# Patient Record
Sex: Female | Born: 1955 | Race: White | Hispanic: No | Smoking: Never smoker
Health system: Southern US, Community
[De-identification: ages and names within clinical notes are randomized; demographics above are authoritative.]

## PROBLEM LIST (undated history)

## (undated) DIAGNOSIS — E079 Disorder of thyroid, unspecified: Secondary | ICD-10-CM

## (undated) DIAGNOSIS — T7840XA Allergy, unspecified, initial encounter: Secondary | ICD-10-CM

## (undated) DIAGNOSIS — E785 Hyperlipidemia, unspecified: Secondary | ICD-10-CM

## (undated) DIAGNOSIS — K648 Other hemorrhoids: Secondary | ICD-10-CM

## (undated) DIAGNOSIS — K5790 Diverticulosis of intestine, part unspecified, without perforation or abscess without bleeding: Secondary | ICD-10-CM

## (undated) DIAGNOSIS — D72829 Elevated white blood cell count, unspecified: Principal | ICD-10-CM

## (undated) DIAGNOSIS — F419 Anxiety disorder, unspecified: Secondary | ICD-10-CM

## (undated) DIAGNOSIS — C9110 Chronic lymphocytic leukemia of B-cell type not having achieved remission: Principal | ICD-10-CM

## (undated) DIAGNOSIS — I10 Essential (primary) hypertension: Secondary | ICD-10-CM

## (undated) HISTORY — DX: Disorder of thyroid, unspecified: E07.9

## (undated) HISTORY — PX: BREAST SURGERY: SHX581

## (undated) HISTORY — DX: Hyperlipidemia, unspecified: E78.5

## (undated) HISTORY — DX: Other hemorrhoids: K64.8

## (undated) HISTORY — DX: Allergy, unspecified, initial encounter: T78.40XA

## (undated) HISTORY — DX: Elevated white blood cell count, unspecified: D72.829

## (undated) HISTORY — DX: Essential (primary) hypertension: I10

## (undated) HISTORY — PX: BREAST BIOPSY: SHX20

## (undated) HISTORY — DX: Chronic lymphocytic leukemia of B-cell type not having achieved remission: C91.10

## (undated) HISTORY — DX: Anxiety disorder, unspecified: F41.9

## (undated) HISTORY — DX: Diverticulosis of intestine, part unspecified, without perforation or abscess without bleeding: K57.90

---

## 1998-11-10 ENCOUNTER — Ambulatory Visit (HOSPITAL_BASED_OUTPATIENT_CLINIC_OR_DEPARTMENT_OTHER): Admission: RE | Admit: 1998-11-10 | Discharge: 1998-11-10 | Payer: Self-pay

## 1999-06-26 ENCOUNTER — Other Ambulatory Visit: Admission: RE | Admit: 1999-06-26 | Discharge: 1999-06-26 | Payer: Self-pay | Admitting: Gynecology

## 2000-01-08 ENCOUNTER — Other Ambulatory Visit: Admission: RE | Admit: 2000-01-08 | Discharge: 2000-01-08 | Payer: Self-pay | Admitting: Gynecology

## 2000-01-14 ENCOUNTER — Encounter: Admission: RE | Admit: 2000-01-14 | Discharge: 2000-01-14 | Payer: Self-pay | Admitting: Gynecology

## 2000-01-14 ENCOUNTER — Encounter: Payer: Self-pay | Admitting: Gynecology

## 2000-01-22 ENCOUNTER — Encounter (INDEPENDENT_AMBULATORY_CARE_PROVIDER_SITE_OTHER): Payer: Self-pay

## 2000-01-22 ENCOUNTER — Other Ambulatory Visit: Admission: RE | Admit: 2000-01-22 | Discharge: 2000-01-22 | Payer: Self-pay | Admitting: Gynecology

## 2000-05-12 ENCOUNTER — Other Ambulatory Visit: Admission: RE | Admit: 2000-05-12 | Discharge: 2000-05-12 | Payer: Self-pay | Admitting: Gynecology

## 2001-01-12 ENCOUNTER — Other Ambulatory Visit: Admission: RE | Admit: 2001-01-12 | Discharge: 2001-01-12 | Payer: Self-pay | Admitting: Gynecology

## 2002-03-25 ENCOUNTER — Encounter: Payer: Self-pay | Admitting: Gynecology

## 2002-03-25 ENCOUNTER — Encounter: Admission: RE | Admit: 2002-03-25 | Discharge: 2002-03-25 | Payer: Self-pay | Admitting: Gynecology

## 2002-03-31 ENCOUNTER — Other Ambulatory Visit: Admission: RE | Admit: 2002-03-31 | Discharge: 2002-03-31 | Payer: Self-pay | Admitting: Gynecology

## 2004-01-23 ENCOUNTER — Other Ambulatory Visit: Admission: RE | Admit: 2004-01-23 | Discharge: 2004-01-23 | Payer: Self-pay | Admitting: Gynecology

## 2004-01-24 ENCOUNTER — Encounter: Admission: RE | Admit: 2004-01-24 | Discharge: 2004-01-24 | Payer: Self-pay | Admitting: Gynecology

## 2005-02-06 ENCOUNTER — Other Ambulatory Visit: Admission: RE | Admit: 2005-02-06 | Discharge: 2005-02-06 | Payer: Self-pay | Admitting: Gynecology

## 2005-02-19 ENCOUNTER — Encounter: Admission: RE | Admit: 2005-02-19 | Discharge: 2005-02-19 | Payer: Self-pay | Admitting: Gynecology

## 2006-02-10 ENCOUNTER — Other Ambulatory Visit: Admission: RE | Admit: 2006-02-10 | Discharge: 2006-02-10 | Payer: Self-pay | Admitting: Gynecology

## 2006-02-20 ENCOUNTER — Encounter: Admission: RE | Admit: 2006-02-20 | Discharge: 2006-02-20 | Payer: Self-pay | Admitting: Gynecology

## 2007-02-24 ENCOUNTER — Encounter: Admission: RE | Admit: 2007-02-24 | Discharge: 2007-02-24 | Payer: Self-pay | Admitting: Gynecology

## 2007-04-02 ENCOUNTER — Other Ambulatory Visit: Admission: RE | Admit: 2007-04-02 | Discharge: 2007-04-02 | Payer: Self-pay | Admitting: Gynecology

## 2007-05-11 DIAGNOSIS — K648 Other hemorrhoids: Secondary | ICD-10-CM

## 2007-05-11 HISTORY — DX: Other hemorrhoids: K64.8

## 2008-03-23 ENCOUNTER — Encounter: Admission: RE | Admit: 2008-03-23 | Discharge: 2008-03-23 | Payer: Self-pay | Admitting: Gynecology

## 2009-04-10 ENCOUNTER — Encounter: Admission: RE | Admit: 2009-04-10 | Discharge: 2009-04-10 | Payer: Self-pay | Admitting: Gynecology

## 2010-05-08 ENCOUNTER — Encounter: Admission: RE | Admit: 2010-05-08 | Discharge: 2010-05-08 | Payer: Self-pay | Admitting: Gynecology

## 2010-07-04 ENCOUNTER — Encounter: Admission: RE | Admit: 2010-07-04 | Discharge: 2010-07-04 | Payer: Self-pay | Admitting: Family Medicine

## 2010-08-28 ENCOUNTER — Ambulatory Visit
Admission: RE | Admit: 2010-08-28 | Discharge: 2010-08-28 | Payer: Self-pay | Source: Home / Self Care | Attending: Gynecology | Admitting: Gynecology

## 2010-12-04 LAB — POCT I-STAT, CHEM 8
BUN: 14 mg/dL (ref 6–23)
Calcium, Ion: 1.2 mmol/L (ref 1.12–1.32)
Chloride: 104 mEq/L (ref 96–112)
HCT: 40 % (ref 36.0–46.0)
Hemoglobin: 13.6 g/dL (ref 12.0–15.0)
Potassium: 4.1 mEq/L (ref 3.5–5.1)
Sodium: 140 mEq/L (ref 135–145)
TCO2: 26 mmol/L (ref 0–100)

## 2011-04-17 ENCOUNTER — Other Ambulatory Visit: Payer: Self-pay | Admitting: Gynecology

## 2011-04-17 DIAGNOSIS — Z1231 Encounter for screening mammogram for malignant neoplasm of breast: Secondary | ICD-10-CM

## 2011-05-14 ENCOUNTER — Ambulatory Visit
Admission: RE | Admit: 2011-05-14 | Discharge: 2011-05-14 | Disposition: A | Discharge status: Home / Self Care | Payer: BC Managed Care – PPO | Source: Ambulatory Visit | Attending: Gynecology | Admitting: Gynecology

## 2011-05-14 DIAGNOSIS — Z1231 Encounter for screening mammogram for malignant neoplasm of breast: Secondary | ICD-10-CM

## 2011-05-30 ENCOUNTER — Other Ambulatory Visit: Payer: Self-pay | Admitting: Gynecology

## 2012-03-04 ENCOUNTER — Other Ambulatory Visit: Payer: Self-pay | Admitting: Physician Assistant

## 2012-03-30 ENCOUNTER — Telehealth: Payer: Self-pay

## 2012-03-30 MED ORDER — LISINOPRIL 5 MG PO TABS: 5.0000 mg | ORAL_TABLET | Freq: Every day | ORAL | Status: DC

## 2012-03-30 NOTE — Telephone Encounter (Signed)
Rx sent in, [facility] notifying patient and advising recheck needed.

## 2012-03-30 NOTE — Telephone Encounter (Signed)
PT STATES SHE IS OUT OF TOWN AND LEFT HER LISINOPRIL AT HOME, NEED TO HAVE [place] CALL THE PHARMACY FOR JUST A FEW UNTIL SHE GET BACK HOME PLEASE CALL [phone]   [facility] ON [address] IN [place]

## 2012-04-24 ENCOUNTER — Encounter: Payer: Self-pay | Admitting: Physician Assistant

## 2012-04-24 DIAGNOSIS — R002 Palpitations: Secondary | ICD-10-CM

## 2012-04-24 DIAGNOSIS — I10 Essential (primary) hypertension: Secondary | ICD-10-CM

## 2012-04-24 DIAGNOSIS — E7800 Pure hypercholesterolemia, unspecified: Secondary | ICD-10-CM

## 2012-06-10 ENCOUNTER — Other Ambulatory Visit: Payer: Self-pay | Admitting: Gynecology

## 2012-06-10 DIAGNOSIS — Z1231 Encounter for screening mammogram for malignant neoplasm of breast: Secondary | ICD-10-CM

## 2012-06-16 ENCOUNTER — Ambulatory Visit (INDEPENDENT_AMBULATORY_CARE_PROVIDER_SITE_OTHER): Payer: BC Managed Care – PPO | Admitting: Family Medicine

## 2012-06-16 VITALS — BP 112/77 | HR 70 | Temp 98.3°F | Resp 16 | Ht 65.5 in | Wt 148.0 lb

## 2012-06-16 DIAGNOSIS — I10 Essential (primary) hypertension: Secondary | ICD-10-CM

## 2012-06-16 DIAGNOSIS — E039 Hypothyroidism, unspecified: Secondary | ICD-10-CM

## 2012-06-16 DIAGNOSIS — Z79899 Other long term (current) drug therapy: Secondary | ICD-10-CM

## 2012-06-16 MED ORDER — ALPRAZOLAM 0.25 MG PO TABS: 0.2500 mg | ORAL_TABLET | Freq: Every evening | ORAL | Status: DC | PRN

## 2012-06-16 MED ORDER — LISINOPRIL 5 MG PO TABS: 5.0000 mg | ORAL_TABLET | Freq: Every day | ORAL | Status: DC

## 2012-06-16 NOTE — Patient Instructions (Addendum)

## 2012-06-20 ENCOUNTER — Encounter: Payer: Self-pay | Admitting: Family Medicine

## 2012-06-20 NOTE — Progress Notes (Signed)
S: This 56 y.o. Cauc female has HTN, Hypothyroidism, Lipid disorder and Menopause symptoms (treated by GYN) who was last seen in Dec 2012 by Dr. Hal Hope.  She is compliant with medications w/o adverse effects. She denies any change in  activity, diaphoresis, fatigue, CP or tightness, palpitations, edema, cough or SOB, dizziness, HA, syncope or weakness. She needs medication refills; no labs today. (Had labs in Aug 2012).    Pt had cardiac evaluation with Dr. Delrae Rend in Dec 2012; palpitations thought to be due to [facility] or PVC (he did not suspect PSVT or other complex arrhythmias). Stress ECHO: 06/19/2010- Normal.  Metoprolol 25 mg was added to be taken prn palpitations.   ROS: As per HPI; otherwise, noncontributory.  O: Filed Vitals:   06/16/12 1428  BP: 112/77                               BMI: 24.5  Pulse: 70  Temp: 98.3 F (36.8 C)  Resp: 16   GEN: In NAD: WN,WD. HENT: [place]/AT; EOMI, conj/scl clear. NECK: Supple w/o LAN or TMG. COR: RRR ; no edema LUNGS: Normal resp rate and effort. NEURO: A&O x 3; CNs intact, otherwise no focal deficits.  LABS (05/01/2011):  CMET- normal (K+= 4.0, Cr= 0.84, LFTs normal)    TSH= 2.101   CRP= 0.1           (01/28/2011):  Lipids-  TChol= 128   TGs= 151   HDL= 39   LDL= 59   A/P: 1. Benign essential HTN -stable and well controlled     Continue current medication and exercise as tolerated  2. Unspecified hypothyroidism     No medication change at this time

## 2012-06-30 ENCOUNTER — Ambulatory Visit
Admission: RE | Admit: 2012-06-30 | Discharge: 2012-06-30 | Disposition: A | Discharge status: Home / Self Care | Payer: BC Managed Care – PPO | Source: Ambulatory Visit | Attending: Gynecology | Admitting: Gynecology

## 2012-06-30 DIAGNOSIS — Z1231 Encounter for screening mammogram for malignant neoplasm of breast: Secondary | ICD-10-CM

## 2012-07-02 ENCOUNTER — Other Ambulatory Visit: Payer: Self-pay | Admitting: Gynecology

## 2012-11-25 ENCOUNTER — Ambulatory Visit (INDEPENDENT_AMBULATORY_CARE_PROVIDER_SITE_OTHER): Payer: BC Managed Care – PPO | Admitting: Physician Assistant

## 2012-11-25 DIAGNOSIS — F419 Anxiety disorder, unspecified: Secondary | ICD-10-CM | POA: Insufficient documentation

## 2012-11-25 DIAGNOSIS — J309 Allergic rhinitis, unspecified: Secondary | ICD-10-CM

## 2012-11-25 DIAGNOSIS — E039 Hypothyroidism, unspecified: Secondary | ICD-10-CM | POA: Insufficient documentation

## 2012-11-25 DIAGNOSIS — R05 Cough: Secondary | ICD-10-CM

## 2012-11-25 DIAGNOSIS — H6690 Otitis media, unspecified, unspecified ear: Secondary | ICD-10-CM

## 2012-11-25 DIAGNOSIS — R059 Cough, unspecified: Secondary | ICD-10-CM

## 2012-11-25 MED ORDER — GUAIFENESIN ER 1200 MG PO TB12: 1.0000 | ORAL_TABLET | Freq: Two times a day (BID) | ORAL | Status: DC | PRN

## 2012-11-25 MED ORDER — AMOXICILLIN 875 MG PO TABS
875.0000 mg | ORAL_TABLET | Freq: Two times a day (BID) | ORAL | Status: DC
Start: 2012-11-25 — End: 2012-12-07

## 2012-11-25 MED ORDER — BENZONATATE 100 MG PO CAPS: 100.0000 mg | ORAL_CAPSULE | Freq: Three times a day (TID) | ORAL | Status: DC | PRN

## 2012-11-25 MED ORDER — IPRATROPIUM BROMIDE 0.03 % NA SOLN: 2.0000 | Freq: Two times a day (BID) | NASAL | Status: DC

## 2012-11-25 MED ORDER — FLUTICASONE PROPIONATE 50 MCG/ACT NA SUSP: 2.0000 | Freq: Every day | NASAL | Status: DC

## 2012-11-25 NOTE — Progress Notes (Signed)
Subjective:    Patient ID: Kelly [name], female    DOB: [birth year], 57 y.o.   MRN: [id number]  HPI This 57 y.o. female presents for evaluation of respiratory illness and new RIGHT ear pain. Began about a week ago with scratchy sore throat, then sinus drainage and cough.  Yesterday she developed RIGHT ear pain.  No fever, chills, nausea, vomiting, diarrhea. No unexplained myalgias/arthralgias.  No rash. She takes an OTC antihistamine most days, without complete resolution of her allergy symptoms or nasal congestion and rhinorrhea.   Past Medical History  Diagnosis Date  . Thyroid disease   . Anxiety   . Allergy   . Hypertension   . Hyperlipidemia     Past Surgical History  Procedure Laterality Date  . Breast surgery Right     benign lesion    Prior to Admission medications   Medication Sig Start Date End Date Taking? Authorizing Provider  ALPRAZolam (XANAX) 0.25 MG tablet Take 1 tablet (0.25 mg total) by mouth at bedtime as needed. 06/16/12  Yes Maurice March, MD  estradiol (VIVELLE-DOT) 0.1 MG/24HR Place 1 patch onto the skin 2 (two) times a week.   Yes Historical Provider, MD  levothyroxine (SYNTHROID, LEVOTHROID) 50 MCG tablet Take 50 mcg by mouth daily.   Yes Historical Provider, MD  lisinopril (PRINIVIL,ZESTRIL) 5 MG tablet Take 1 tablet (5 mg total) by mouth daily. 06/16/12  Yes Maurice March, MD  progesterone (PROMETRIUM) 200 MG capsule Take 200 mg by mouth daily.   Yes Historical Provider, MD  rosuvastatin (CRESTOR) 10 MG tablet Take 10 mg by mouth daily. 1/2 tab qd   Yes Historical Provider, MD    No Known Allergies  History   Social History  . Marital Status: Divorced    Spouse Name: n/a    Number of Children: 2  . Years of Education: 13+   Occupational History  . accountant    Social History Main Topics  . Smoking status: Never Smoker   . Smokeless tobacco: Never Used  . Alcohol Use: Yes     Comment: rarely  . Drug Use: No  . Sexually  Active: No   Other Topics Concern  . Not on file   Social History Narrative   Lives alone.  Her adult children live nearby.    Family History  Problem Relation Age of Onset  . Cancer Mother 92    pancreatic cancer  . Cancer Father 69    lung cancer; +tobacco    Review of Systems As above.    Objective:   Physical Exam Blood pressure 138/70, pulse 84, temperature 98.8 F (37.1 C), resp. rate 16, height 5\' 5"  (1.651 m), weight 150 lb (68.04 kg). Body mass index is 24.96 kg/(m^2). Well-developed, well nourished WF who is awake, alert and oriented, in NAD. HEENT: [place]/AT, PERRL, EOMI.  Sclera and conjunctiva are clear.  EAC are patent, LEFT TM is normal in appearance. RIGHT TM is opaque, injected and bulging. Nasal mucosa is congested, dark pink and moist. OP is clear. Neck: supple, non-tender, no lymphadenopathy, thyromegaly. Heart: RRR, no murmur Lungs: normal effort, CTA Extremities: no cyanosis, clubbing or edema. Skin: warm and dry without rash. Psychologic: good mood and appropriate affect, normal speech and behavior.     Assessment & Plan:  AOM (acute otitis media), right - Plan: ipratropium (ATROVENT) 0.03 % nasal spray, Guaifenesin (MUCINEX MAXIMUM STRENGTH) 1200 MG TB12, amoxicillin (AMOXIL) 875 MG tablet  Allergic rhinitis - Plan: fluticasone (FLONASE) 50  MCG/ACT nasal spray  Cough - Plan: benzonatate (TESSALON) 100 MG capsule

## 2012-11-25 NOTE — Patient Instructions (Signed)
Get plenty of rest and drink at least 64 ounces of water daily. 

## 2012-12-05 ENCOUNTER — Encounter: Payer: Self-pay | Admitting: Physician Assistant

## 2012-12-07 ENCOUNTER — Other Ambulatory Visit: Payer: Self-pay | Admitting: Physician Assistant

## 2012-12-07 MED ORDER — AMOXICILLIN-POT CLAVULANATE 875-125 MG PO TABS: 1.0000 | ORAL_TABLET | Freq: Two times a day (BID) | ORAL | Status: DC

## 2012-12-14 ENCOUNTER — Telehealth: Payer: Self-pay

## 2012-12-14 MED ORDER — FLUCONAZOLE 150 MG PO TABS: 150.0000 mg | ORAL_TABLET | Freq: Once | ORAL | Status: DC

## 2012-12-14 NOTE — Telephone Encounter (Signed)
Pt is requesting Diflucan  [employer] [facility] [address]   [phone]

## 2012-12-14 NOTE — Telephone Encounter (Signed)
Called patient to advise this was sent in.

## 2013-03-16 ENCOUNTER — Encounter: Payer: Self-pay | Admitting: Physician Assistant

## 2013-03-16 DIAGNOSIS — R002 Palpitations: Secondary | ICD-10-CM

## 2013-04-14 ENCOUNTER — Ambulatory Visit (INDEPENDENT_AMBULATORY_CARE_PROVIDER_SITE_OTHER): Payer: BC Managed Care – PPO | Admitting: Physician Assistant

## 2013-04-14 VITALS — BP 110/70 | HR 90 | Temp 98.4°F | Resp 16 | Ht 65.0 in | Wt 150.4 lb

## 2013-04-14 DIAGNOSIS — J069 Acute upper respiratory infection, unspecified: Secondary | ICD-10-CM

## 2013-04-14 DIAGNOSIS — J029 Acute pharyngitis, unspecified: Secondary | ICD-10-CM

## 2013-04-14 DIAGNOSIS — I10 Essential (primary) hypertension: Secondary | ICD-10-CM

## 2013-04-14 LAB — POCT RAPID STREP A (OFFICE): Rapid Strep A Screen: NEGATIVE

## 2013-04-14 MED ORDER — AZITHROMYCIN 250 MG PO TABS: ORAL_TABLET | ORAL | Status: DC

## 2013-04-14 MED ORDER — PREDNISONE 20 MG PO TABS: ORAL_TABLET | ORAL | Status: DC

## 2013-04-14 MED ORDER — LISINOPRIL 5 MG PO TABS: 5.0000 mg | ORAL_TABLET | Freq: Every day | ORAL | Status: DC

## 2013-04-14 NOTE — Progress Notes (Signed)
Patient ID: Kelly [name] MRN: [id number], DOB: Apr 07, 1956, 57 y.o. Date of Encounter: 04/14/2013, 3:14 PM  Primary Physician: No primary provider on file.  Chief Complaint: Sore throat x 3 days and nasal congestion  HPI: 57 y.o. female with history below presents with 3 day history of sore throat and nasal congestion. Afebrile. No chills. Feels like her sore throat is secondary to post nasal drip. Some cough secondary to clearing her throat from post nasal drip. No chest congestion. No sinus pressure. No ear fullness or otalgia. No known sick contacts, but she is not certain if her grandchildren have been sick.   She also needs a refill on her lisinopril 5 mg. Takes daily without issues. No CP, chest tightness, headaches, vision changes, or focal deficits. Has been signed off on from Dr. Jacinto Halim regarding her palpitations. She will see him prn.    Past Medical History  Diagnosis Date  . Thyroid disease   . Anxiety   . Allergy   . Hypertension   . Hyperlipidemia      Home Meds: Prior to Admission medications   Medication Sig Start Date End Date Taking? Authorizing Provider  ALPRAZolam (XANAX) 0.25 MG tablet Take 1 tablet (0.25 mg total) by mouth at bedtime as needed. 06/16/12  Yes Maurice March, MD  estradiol (VIVELLE-DOT) 0.1 MG/24HR Place 1 patch onto the skin 2 (two) times a week.   Yes Historical Provider, MD  levothyroxine (SYNTHROID, LEVOTHROID) 50 MCG tablet Take 50 mcg by mouth daily.   Yes Historical Provider, MD  lisinopril (PRINIVIL,ZESTRIL) 5 MG tablet Take 1 tablet (5 mg total) by mouth daily. 06/16/12  Yes Maurice March, MD  progesterone (PROMETRIUM) 200 MG capsule Take 200 mg by mouth daily.   Yes Historical Provider, MD  rosuvastatin (CRESTOR) 10 MG tablet Take 10 mg by mouth daily. 1/2 tab qd   Yes Historical Provider, MD    Allergies: No Known Allergies  History   Social History  . Marital Status: Divorced    Spouse Name: n/a    Number of  Children: 2  . Years of Education: 13+   Occupational History  . accountant    Social History Main Topics  . Smoking status: Never Smoker   . Smokeless tobacco: Never Used  . Alcohol Use: Yes     Comment: rarely  . Drug Use: No  . Sexually Active: No   Other Topics Concern  . Not on file   Social History Narrative   Lives alone.  Her adult children live nearby.     Review of Systems: Constitutional: negative for chills, fever, or fatigue  HEENT: see above Cardiovascular: negative for chest pain or palpitations Respiratory: negative for wheezing, shortness of breath, or cough Abdominal: negative for abdominal pain, nausea, vomiting, or diarrhea Dermatological: negative for rash Neurologic: negative for headache, dizziness, or syncope   Physical Exam: Blood pressure 110/70, pulse 90, temperature 98.4 F (36.9 C), temperature source Oral, resp. rate 16, height 5\' 5"  (1.651 m), weight 150 lb 6.4 oz (68.221 kg), SpO2 99.00%., Body mass index is 25.03 kg/(m^2). General: Well developed, well nourished, in no acute distress. Head: Normocephalic, atraumatic, eyes without discharge, sclera non-icteric, nares are without discharge. Bilateral auditory canals clear, TM's are without perforation, pearly grey and translucent with reflective cone of light bilaterally. No sinus TTP. Oral cavity moist, posterior pharynx with post nasal drip and mild erythema. No exudate or peritonsillar abscess. Uvula midline.   Neck: Supple. No thyromegaly. Full  ROM. No lymphadenopathy. Lungs: Clear bilaterally to auscultation without wheezes, rales, or rhonchi. Breathing is unlabored. Heart: RRR with S1 S2. No murmurs, rubs, or gallops appreciated. Msk:  Strength and tone normal for age. Extremities/Skin: Warm and dry. No clubbing or cyanosis. No edema. No rashes or suspicious lesions. Neuro: Alert and oriented X 3. Moves all extremities spontaneously. Gait is normal. CNII-XII grossly in tact. Psych:   Responds to questions appropriately with a normal affect.   Labs: Results for orders placed in visit on 04/14/13  POCT RAPID STREP A (OFFICE)      Result Value Range   Rapid Strep A Screen Negative  Negative    BMP pending  ASSESSMENT AND PLAN:  57 y.o. female with URI and well controlled HTN  1) URI -Prednisone 20 mg #12 3x2, 2x2, 1x2 no RF -Azithromycin 250 MG #6 2 po first day then 1 po next 4 days no RF -Restart Atrovent nasal spray  2) Hypertension -Well controlled -Refilled lisinopril 5 mg 1 po daily #90 RF 1 -Healthy diet and exercise   Signed, Eula Listen, PA-C 04/14/2013 3:14 PM

## 2013-04-15 LAB — BASIC METABOLIC PANEL
CO2: 29 mEq/L (ref 19–32)
Chloride: 102 mEq/L (ref 96–112)
Sodium: 138 mEq/L (ref 135–145)

## 2013-04-26 ENCOUNTER — Encounter: Payer: Self-pay | Admitting: Physician Assistant

## 2013-04-26 MED ORDER — AMOXICILLIN 875 MG PO TABS: 875.0000 mg | ORAL_TABLET | Freq: Two times a day (BID) | ORAL | Status: DC

## 2013-07-09 ENCOUNTER — Other Ambulatory Visit: Payer: Self-pay

## 2013-07-09 DIAGNOSIS — Z1231 Encounter for screening mammogram for malignant neoplasm of breast: Secondary | ICD-10-CM

## 2013-07-13 ENCOUNTER — Other Ambulatory Visit: Payer: Self-pay | Admitting: Family Medicine

## 2013-07-29 ENCOUNTER — Other Ambulatory Visit: Payer: Self-pay

## 2013-08-06 ENCOUNTER — Ambulatory Visit
Admission: RE | Admit: 2013-08-06 | Discharge: 2013-08-06 | Disposition: A | Discharge status: Home / Self Care | Payer: BC Managed Care – PPO | Source: Ambulatory Visit

## 2013-08-06 DIAGNOSIS — Z1231 Encounter for screening mammogram for malignant neoplasm of breast: Secondary | ICD-10-CM

## 2013-09-02 ENCOUNTER — Ambulatory Visit (INDEPENDENT_AMBULATORY_CARE_PROVIDER_SITE_OTHER): Payer: BC Managed Care – PPO | Admitting: Physician Assistant

## 2013-09-02 ENCOUNTER — Encounter: Payer: Self-pay | Admitting: Physician Assistant

## 2013-09-02 VITALS — BP 121/74 | HR 75 | Temp 98.2°F | Resp 16 | Ht 65.25 in | Wt 158.0 lb

## 2013-09-02 DIAGNOSIS — F411 Generalized anxiety disorder: Secondary | ICD-10-CM

## 2013-09-02 DIAGNOSIS — E7800 Pure hypercholesterolemia, unspecified: Secondary | ICD-10-CM

## 2013-09-02 DIAGNOSIS — F419 Anxiety disorder, unspecified: Secondary | ICD-10-CM

## 2013-09-02 DIAGNOSIS — Z1159 Encounter for screening for other viral diseases: Secondary | ICD-10-CM

## 2013-09-02 DIAGNOSIS — E039 Hypothyroidism, unspecified: Secondary | ICD-10-CM

## 2013-09-02 DIAGNOSIS — I10 Essential (primary) hypertension: Secondary | ICD-10-CM

## 2013-09-02 DIAGNOSIS — R7989 Other specified abnormal findings of blood chemistry: Secondary | ICD-10-CM

## 2013-09-02 DIAGNOSIS — Z0000 Encounter for general adult medical examination without abnormal findings: Secondary | ICD-10-CM

## 2013-09-02 DIAGNOSIS — J309 Allergic rhinitis, unspecified: Secondary | ICD-10-CM

## 2013-09-02 LAB — POCT URINALYSIS DIPSTICK
Bilirubin, UA: NEGATIVE
Blood, UA: NEGATIVE
Glucose, UA: NEGATIVE
Ketones, UA: NEGATIVE
pH, UA: 5.5

## 2013-09-02 LAB — COMPREHENSIVE METABOLIC PANEL
ALT: 15 U/L (ref 0–35)
Albumin: 4.5 g/dL (ref 3.5–5.2)
CO2: 29 mEq/L (ref 19–32)
Calcium: 9.4 mg/dL (ref 8.4–10.5)
Chloride: 101 mEq/L (ref 96–112)
Glucose, Bld: 87 mg/dL (ref 70–99)
Potassium: 4.1 mEq/L (ref 3.5–5.3)
Sodium: 135 mEq/L (ref 135–145)
Total Bilirubin: 0.4 mg/dL (ref 0.3–1.2)
Total Protein: 6.7 g/dL (ref 6.0–8.3)

## 2013-09-02 LAB — POCT UA - MICROSCOPIC ONLY
Mucus, UA: NEGATIVE
RBC, urine, microscopic: NEGATIVE

## 2013-09-02 LAB — LIPID PANEL: Total CHOL/HDL Ratio: 3 Ratio

## 2013-09-02 LAB — TSH: TSH: 2.895 u[IU]/mL (ref 0.350–4.500)

## 2013-09-02 MED ORDER — LEVOTHYROXINE SODIUM 50 MCG PO TABS: 50.0000 ug | ORAL_TABLET | Freq: Every day | ORAL | Status: DC

## 2013-09-02 MED ORDER — ROSUVASTATIN CALCIUM 10 MG PO TABS: 5.0000 mg | ORAL_TABLET | Freq: Every day | ORAL | Status: DC

## 2013-09-02 NOTE — Progress Notes (Signed)
Subjective:    Patient ID: Kelly [name], female    DOB: 1956/07/25, 57 y.o.   MRN: [id number]   PCP: Celestino Ackerman, PA-C  Chief Complaint  Patient presents with  . Annual Exam   HPI  Presents for Annual Wellness Exam.  Had a colonoscopy 6-7 years ago with Dr. Kinnie Scales.  Reportedly normal. Breast and paps with GYN.  Current with mammogram, pap, etc. Flu vaccine is current.    Active Ambulatory Problems    Diagnosis Date Noted  . Essential hypertension, benign 04/24/2012  . Palpitations 04/24/2012  . Pure hypercholesterolemia 04/24/2012  . Anxiety 11/25/2012  . Hypothyroidism 11/25/2012  . Allergic rhinitis 11/25/2012   Resolved Ambulatory Problems    Diagnosis Date Noted  . No Resolved Ambulatory Problems   Past Medical History  Diagnosis Date  . Thyroid disease   . Allergy   . Hypertension   . Hyperlipidemia     Past Surgical History  Procedure Laterality Date  . Breast surgery Right     benign lesion    No Known Allergies  Prior to Admission medications   Medication Sig Start Date End Date Taking? Authorizing Provider  ALPRAZolam (XANAX) 0.25 MG tablet Take 1 tablet (0.25 mg total) by mouth at bedtime as needed. 06/16/12  Yes Maurice March, MD  estradiol (VIVELLE-DOT) 0.1 MG/24HR Place 1 patch onto the skin 2 (two) times a week.   Yes Historical Provider, MD  levothyroxine (SYNTHROID, LEVOTHROID) 50 MCG tablet Take 1 tablet (50 mcg total) by mouth daily. 09/02/13  Yes Wadsworth Skolnick S Taeko Schaffer, PA-C  lisinopril (PRINIVIL,ZESTRIL) 5 MG tablet Take 1 tablet (5 mg total) by mouth daily. 04/14/13  Yes Ryan M Dunn, PA-C  progesterone (PROMETRIUM) 100 MG capsule Take 100 mg by mouth daily.   Yes Historical Provider, MD  rosuvastatin (CRESTOR) 10 MG tablet Take 0.5 tablets (5 mg total) by mouth daily. 1/2 tab qd 09/02/13  Yes [place] Liz Tessa Lerner, PA-C    History   Social History  . Marital Status: Divorced    Spouse Name: n/a    Number of Children: 2  . Years  of Education: 13+   Occupational History  . accountant    Social History Main Topics  . Smoking status: Never Smoker   . Smokeless tobacco: Never Used  . Alcohol Use: Yes     Comment: rarely  . Drug Use: No  . Sexual Activity: No   Other Topics Concern  . None   Social History Narrative   Lives alone.  Her adult children live nearby.    family history includes Cancer (age of onset: 47) in her mother; Cancer (age of onset: 61) in her father. indicated that her mother is deceased. She indicated that her father is deceased. She indicated that her sister is alive. She indicated that her daughter is alive. She indicated that her son is alive.    Review of Systems  Constitutional: Negative.   HENT: Negative.   Eyes: Negative.   Respiratory: Negative.   Cardiovascular: Negative.   Gastrointestinal: Negative.   Endocrine: Negative.   Genitourinary: Negative for dysuria, urgency, frequency, hematuria, flank pain, decreased urine volume, vaginal discharge, enuresis, difficulty urinating, genital sores, vaginal pain, menstrual problem, pelvic pain and dyspareunia. Vaginal bleeding: spotting for 3 days, since dose change for estrogen and progesterone.  Musculoskeletal: Negative.   Skin: Negative.   Allergic/Immunologic: Negative.   Neurological: Negative.   Hematological: Negative.   Psychiatric/Behavioral: Negative.  Objective:   Physical Exam  Vitals reviewed. Constitutional: She is oriented to person, place, and time. Vital signs are normal. She appears well-developed and well-nourished. She is active and cooperative. No distress.  HENT:  Head: Normocephalic and atraumatic.  Right Ear: Hearing, tympanic membrane, external ear and ear canal normal. No foreign bodies.  Left Ear: Hearing, tympanic membrane, external ear and ear canal normal. No foreign bodies.  Nose: Nose normal.  Mouth/Throat: Uvula is midline, oropharynx is clear and moist and mucous membranes are  normal. No oral lesions. Normal dentition. No dental abscesses or uvula swelling. No oropharyngeal exudate.  Eyes: Conjunctivae, EOM and lids are normal. Pupils are equal, round, and reactive to light. Right eye exhibits no discharge. Left eye exhibits no discharge. No scleral icterus.  Fundoscopic exam:      The right eye shows no arteriolar narrowing, no AV nicking, no exudate, no hemorrhage and no papilledema. The right eye shows red reflex.       The left eye shows no arteriolar narrowing, no AV nicking, no exudate, no hemorrhage and no papilledema. The left eye shows red reflex.  Neck: Trachea normal, normal range of motion and full passive range of motion without pain. Neck supple. No spinous process tenderness and no muscular tenderness present. No mass and no thyromegaly present.  Cardiovascular: Normal rate, regular rhythm, normal heart sounds, intact distal pulses and normal pulses.   Pulmonary/Chest: Effort normal and breath sounds normal.  Abdominal: Soft. Bowel sounds are normal. She exhibits no distension and no mass. There is no tenderness. There is no rebound and no guarding.  Musculoskeletal: She exhibits no edema and no tenderness.       Cervical back: Normal.       Thoracic back: Normal.       Lumbar back: Normal.  Lymphadenopathy:       Head (right side): No tonsillar, no preauricular, no posterior auricular and no occipital adenopathy present.       Head (left side): No tonsillar, no preauricular, no posterior auricular and no occipital adenopathy present.    She has no cervical adenopathy.       Right: No supraclavicular adenopathy present.       Left: No supraclavicular adenopathy present.  Neurological: She is alert and oriented to person, place, and time. She has normal strength and normal reflexes. No cranial nerve deficit. She exhibits normal muscle tone. Coordination and gait normal.  Skin: Skin is warm, dry and intact. No rash noted. She is not diaphoretic. No  cyanosis or erythema. Nails show no clubbing.  Psychiatric: She has a normal mood and affect. Her speech is normal and behavior is normal. Judgment and thought content normal.      Assessment & Plan:  1. Routine general medical examination at a health care facility Age appropriate anticipatory guidance provided. - POCT UA - Microscopic Only - POCT urinalysis dipstick  2. Essential hypertension, benign Controlled.  Continue current treatment. - CBC with Differential - Comprehensive metabolic panel  3. Pure hypercholesterolemia Await labs. - Comprehensive metabolic panel - Lipid panel - rosuvastatin (CRESTOR) 10 MG tablet; Take 0.5 tablets (5 mg total) by mouth daily. 1/2 tab qd  Dispense: 90 tablet; Refill: 1  4. Hypothyroidism Await labs - TSH - levothyroxine (SYNTHROID, LEVOTHROID) 50 MCG tablet; Take 1 tablet (50 mcg total) by mouth daily.  Dispense: 90 tablet; Refill: 1  5. Anxiety Controlled with PRN alprazolam.  6. Allergic rhinitis Controlled.  7. Need for hepatitis C screening  test - Hepatitis C antibody  Return in about 6 months (around 03/03/2014).  Fernande Bras, PA-C Physician Assistant-Certified [facility]

## 2013-09-02 NOTE — Patient Instructions (Signed)
I will contact you with your lab results as soon as they are available.   If you have not heard from me in 2 weeks, please contact me.  The fastest way to get your results is to register for My Chart (see the instructions on the last page of this printout).  Keeping You Healthy  Get These Tests  Blood Pressure- Have your blood pressure checked by your healthcare provider at least once a year.  Normal blood pressure is 120/80.  Weight- Have your body mass index (BMI) calculated to screen for obesity.  BMI is a measure of body fat based on height and weight.  You can calculate your own BMI at www.nhlbisupport.com/bmi/  Cholesterol- Have your cholesterol checked every year.  Diabetes- Have your blood sugar checked every year if you have high blood pressure, high cholesterol, a family history of diabetes or if you are overweight.  Pap Smear- Have a pap smear every 1 to 5 years if you have been sexually active.  If you are older than 65 and recent pap smears have been normal you may not need additional pap smears.  In addition, if you have had a hysterectomy  For benign disease additional pap smears are not necessary.  Mammogram-Yearly mammograms are essential for early detection of breast cancer  Screening for Colon Cancer- Colonoscopy starting at age 50. Screening may begin sooner depending on your family history and other health conditions.  Follow up colonoscopy as directed by your Gastroenterologist.  Screening for Osteoporosis- Screening begins at age 65 with bone density scanning, sooner if you are at higher risk for developing Osteoporosis.  Get these medicines  Calcium with Vitamin D- Your body requires 1200-1500 mg of Calcium a day and 800-1000 IU of Vitamin D a day.  You can only absorb 500 mg of Calcium at a time therefore Calcium must be taken in 2 or 3 separate doses throughout the day.  Hormones- Hormone therapy has been associated with increased risk for certain cancers and  heart disease.  Talk to your healthcare provider about if you need relief from menopausal symptoms.  Aspirin- Ask your healthcare provider about taking Aspirin to prevent Heart Disease and Stroke.  Get these Immuniztions  Flu shot- Every fall  Pneumonia shot- Once after the age of 65; if you are younger ask your healthcare provider if you need a pneumonia shot.  Tetanus- Every ten years.  Zostavax- Once after the age of 60 to prevent shingles.  Take these steps  Don't smoke- Your healthcare provider can help you quit. For tips on how to quit, ask your healthcare provider or go to www.smokefree.gov or call 1-800 QUIT-NOW.  Be physically active- Exercise 5 days a week for a minimum of 30 minutes.  If you are not already physically active, start slow and gradually work up to 30 minutes of moderate physical activity.  Try walking, dancing, bike riding, swimming, etc.  Eat a healthy diet- Eat a variety of healthy foods such as fruits, vegetables, whole grains, low fat milk, low fat cheeses, yogurt, lean meats, chicken, fish, eggs, dried beans, tofu, etc.  For more information go to www.thenutritionsource.org  Dental visit- Brush and floss teeth twice daily; visit your dentist twice a year.  Eye exam- Visit your Optometrist or Ophthalmologist yearly.  Drink alcohol in moderation- Limit alcohol intake to one drink or less a day.  Never drink and drive.  Depression- Your emotional health is as important as your physical health.  If you're feeling   down or losing interest in things you normally enjoy, please talk to your healthcare provider.  Seat Belts- can save your life; always wear one  Smoke/Carbon Monoxide detectors- These detectors need to be installed on the appropriate level of your home.  Replace batteries at least once a year.  Violence- If anyone is threatening or hurting you, please tell your healthcare provider.  Living Will/ Health care power of attorney- Discuss with your  healthcare provider and family. 

## 2013-09-03 LAB — CBC WITH DIFFERENTIAL/PLATELET
Basophils Relative: 0 % (ref 0–1)
Eosinophils Absolute: 0.2 10*3/uL (ref 0.0–0.7)
HCT: 39.2 % (ref 36.0–46.0)
Hemoglobin: 13.6 g/dL (ref 12.0–15.0)
Lymphocytes Relative: 71 % — ABNORMAL HIGH (ref 12–46)
Lymphs Abs: 13.2 10*3/uL — ABNORMAL HIGH (ref 0.7–4.0)
MCH: 31.5 pg (ref 26.0–34.0)
MCHC: 34.7 g/dL (ref 30.0–36.0)
Monocytes Relative: 4 % (ref 3–12)
Neutrophils Relative %: 24 % — ABNORMAL LOW (ref 43–77)
RBC: 4.32 MIL/uL (ref 3.87–5.11)

## 2013-09-03 LAB — HEPATITIS C ANTIBODY: HCV Ab: NEGATIVE

## 2013-09-06 NOTE — Addendum Note (Signed)
Addended by: Fernande Bras on: 09/06/2013 03:56 PM   Modules accepted: Orders

## 2013-09-09 ENCOUNTER — Other Ambulatory Visit (INDEPENDENT_AMBULATORY_CARE_PROVIDER_SITE_OTHER): Payer: BC Managed Care – PPO | Admitting: *Deleted

## 2013-09-09 DIAGNOSIS — R7989 Other specified abnormal findings of blood chemistry: Secondary | ICD-10-CM

## 2013-09-09 NOTE — Progress Notes (Signed)
Pt here for labs only. 

## 2013-09-10 LAB — IMMUNOPHENOTYPING BY FLOW CYTOMETRY

## 2013-09-14 ENCOUNTER — Encounter: Payer: Self-pay | Admitting: Hematology and Oncology

## 2013-09-14 ENCOUNTER — Telehealth: Payer: Self-pay | Admitting: *Deleted

## 2013-09-14 ENCOUNTER — Other Ambulatory Visit: Payer: Self-pay | Admitting: Hematology and Oncology

## 2013-09-14 ENCOUNTER — Telehealth: Payer: Self-pay | Admitting: Hematology and Oncology

## 2013-09-14 DIAGNOSIS — D72829 Elevated white blood cell count, unspecified: Secondary | ICD-10-CM

## 2013-09-14 HISTORY — DX: Elevated white blood cell count, unspecified: D72.829

## 2013-09-14 NOTE — Telephone Encounter (Signed)
Left Vm for pt requesting she call back to confirm appt made for tomorrow at 10:30 am.

## 2013-09-14 NOTE — Telephone Encounter (Signed)
I placed order to see tomorrow

## 2013-09-14 NOTE — Telephone Encounter (Signed)
C/D 09/14/13 for appt. 09/20/13

## 2013-09-14 NOTE — Telephone Encounter (Signed)
S/W REFERRING OFFICE AND GVE NP APPT 12/29 @ 9:30 W/DR. GORSUCH REFERRING DR. [name] [name] DX- ABN CBC  WELCOME PACKET MAILED.

## 2013-09-14 NOTE — Telephone Encounter (Signed)
C/D 09/14/13 for appt. 09/15/13

## 2013-09-14 NOTE — Telephone Encounter (Signed)
Pt left VM asking if any way she can get in to see Dr. Bertis Ruddy sooner than 12/29?  States was told by her PCP she may have Leukemia and she is very concerned.

## 2013-09-14 NOTE — Telephone Encounter (Signed)
Pt returned call and will come in tomorrow at 10:30 am.

## 2013-09-14 NOTE — Addendum Note (Signed)
Addended by: Fernande Bras on: 09/14/2013 01:31 PM   Modules accepted: Orders

## 2013-09-15 ENCOUNTER — Ambulatory Visit: Payer: BC Managed Care – PPO

## 2013-09-15 ENCOUNTER — Ambulatory Visit (HOSPITAL_BASED_OUTPATIENT_CLINIC_OR_DEPARTMENT_OTHER): Payer: BC Managed Care – PPO | Admitting: Hematology and Oncology

## 2013-09-15 ENCOUNTER — Encounter: Payer: Self-pay | Admitting: Hematology and Oncology

## 2013-09-15 VITALS — BP 120/69 | HR 78 | Temp 98.2°F | Resp 18 | Ht 65.0 in | Wt 157.1 lb

## 2013-09-15 DIAGNOSIS — C9110 Chronic lymphocytic leukemia of B-cell type not having achieved remission: Secondary | ICD-10-CM

## 2013-09-15 HISTORY — DX: Chronic lymphocytic leukemia of B-cell type not having achieved remission: C91.10

## 2013-09-15 NOTE — Progress Notes (Signed)
[facility] CONSULT NOTE  Patient Care Team: Fernande Bras, PA-C as PCP - General (Physician Assistant) Jean Rosenthal, NP as Nurse Practitioner (Gynecology) Artis Delay, MD as Consulting Physician (Hematology and Oncology)  CHIEF COMPLAINTS/PURPOSE OF CONSULTATION:  Lymphocytosis, newly diagnosed CLL  HISTORY OF PRESENTING ILLNESS:  Kelly [name] 57 y.o. female is here because of recent diagnosis of CLL. On 09/02/2013, she went to her primary care provider for routine visit and have routine blood work done. We do not have results of prior blood work. On 09/02/2013, CBC showed a white blood cell count of 18.5 with 71% lymphocytes. She has no anemia or thrombocytopenia. Flow cytometry confirmed the diagnosis of CLL She denies any new lymphadenopathy. She denies any recent fever, chills, night sweats or abnormal weight loss The recent infection of bleeding. She is up-to-date with all her preventive care including colonoscopy, Pap smear and screening mammogram She is up-to-date with influenza vaccination.  MEDICAL HISTORY:  Past Medical History  Diagnosis Date  . Thyroid disease   . Anxiety   . Allergy   . Hypertension   . Hyperlipidemia   . Leukocytosis, unspecified 09/14/2013  . CLL (chronic lymphocytic leukemia) 09/15/2013    SURGICAL HISTORY: Past Surgical History  Procedure Laterality Date  . Breast surgery Right     benign lesion    SOCIAL HISTORY: History   Social History  . Marital Status: Divorced    Spouse Name: n/a    Number of Children: 2  . Years of Education: 13+   Occupational History  . accountant   .     Social History Main Topics  . Smoking status: Never Smoker   . Smokeless tobacco: Never Used  . Alcohol Use: Yes     Comment: rarely  . Drug Use: No  . Sexual Activity: No   Other Topics Concern  . Not on file   Social History Narrative   Lives alone.  Her adult children live nearby.    FAMILY HISTORY: Family  History  Problem Relation Age of Onset  . Cancer Mother 6    pancreatic cancer  . Cancer Father 18    lung cancer; +tobacco    ALLERGIES:  has No Known Allergies.  MEDICATIONS:  Current Outpatient Prescriptions  Medication Sig Dispense Refill  . ALPRAZolam (XANAX) 0.25 MG tablet Take 1 tablet (0.25 mg total) by mouth at bedtime as needed.  30 tablet  1  . estradiol (VIVELLE-DOT) 0.1 MG/24HR Place 0.5 patches onto the skin 2 (two) times a week.       . levothyroxine (SYNTHROID, LEVOTHROID) 50 MCG tablet Take 1 tablet (50 mcg total) by mouth daily.  90 tablet  1  . lisinopril (PRINIVIL,ZESTRIL) 5 MG tablet Take 1 tablet (5 mg total) by mouth daily.  90 tablet  1  . progesterone (PROMETRIUM) 100 MG capsule Take 100 mg by mouth daily.      . rosuvastatin (CRESTOR) 10 MG tablet Take 0.5 tablets (5 mg total) by mouth daily. 1/2 tab qd  90 tablet  1   No current facility-administered medications for this visit.    REVIEW OF SYSTEMS:   Constitutional: Denies fevers, chills or abnormal night sweats Eyes: Denies blurriness of vision, double vision or watery eyes Ears, nose, mouth, throat, and face: Denies mucositis or sore throat Respiratory: Denies cough, dyspnea or wheezes Cardiovascular: Denies palpitation, chest discomfort or lower extremity swelling Gastrointestinal:  Denies nausea, heartburn or change in bowel habits Skin: Denies abnormal  skin rashes Lymphatics: Denies new lymphadenopathy or easy bruising Neurological:Denies numbness, tingling or new weaknesses Behavioral/Psych: Mood is stable, no new changes  All other systems were reviewed with the patient and are negative.  PHYSICAL EXAMINATION: ECOG PERFORMANCE STATUS: 0 - Asymptomatic  Filed Vitals:   09/15/13 1038  BP: 120/69  Pulse: 78  Temp: 98.2 F (36.8 C)  Resp: 18   Filed Weights   09/15/13 1038  Weight: 157 lb 1.6 oz (71.26 kg)    GENERAL:alert, no distress and comfortable SKIN: skin color, texture,  turgor are normal, no rashes or significant lesions EYES: normal, conjunctiva are pink and non-injected, sclera clear OROPHARYNX:no exudate, no erythema and lips, buccal mucosa, and tongue normal  NECK: supple, thyroid normal size, non-tender, without nodularity LYMPH:  no palpable lymphadenopathy in the cervical, axillary or inguinal LUNGS: clear to auscultation and percussion with normal breathing effort HEART: regular rate & rhythm and no murmurs and no lower extremity edema ABDOMEN:abdomen soft, non-tender and normal bowel sounds Musculoskeletal:no cyanosis of digits and no clubbing  PSYCH: alert & oriented x 3 with fluent speech NEURO: no focal motor/sensory deficits  LABORATORY DATA:  I have reviewed the data as listed Recent Results (from the past 2160 hour(s))  CBC WITH DIFFERENTIAL     Status: Abnormal   Collection Time    09/02/13  8:17 AM      Result Value Range   WBC 18.5 (*) 4.0 - 10.5 K/uL   RBC 4.32  3.87 - 5.11 MIL/uL   Hemoglobin 13.6  12.0 - 15.0 g/dL   HCT 16.1  09.6 - 04.5 %   MCV 90.7  78.0 - 100.0 fL   MCH 31.5  26.0 - 34.0 pg   MCHC 34.7  30.0 - 36.0 g/dL   RDW 40.9  81.1 - 91.4 %   Platelets 217  150 - 400 K/uL   Comment: Platelet clumps noted on smear-count appears adequate.   Neutrophils Relative % 24 (*) 43 - 77 %   Neutro Abs 4.3  1.7 - 7.7 K/uL   Lymphocytes Relative 71 (*) 12 - 46 %   Lymphs Abs 13.2 (*) 0.7 - 4.0 K/uL   Monocytes Relative 4  3 - 12 %   Monocytes Absolute 0.8  0.1 - 1.0 K/uL   Eosinophils Relative 1  0 - 5 %   Eosinophils Absolute 0.2  0.0 - 0.7 K/uL   Basophils Relative 0  0 - 1 %   Basophils Absolute 0.0  0.0 - 0.1 K/uL   Smear Review       Comment: Atypical lymphs.     RBC are unremarkable     Pending path review  COMPREHENSIVE METABOLIC PANEL     Status: None   Collection Time    09/02/13  8:17 AM      Result Value Range   Sodium 135  135 - 145 mEq/L   Potassium 4.1  3.5 - 5.3 mEq/L   Chloride 101  96 - 112 mEq/L    CO2 29  19 - 32 mEq/L   Glucose, Bld 87  70 - 99 mg/dL   BUN 14  6 - 23 mg/dL   Creat 7.82  9.56 - 2.13 mg/dL   Total Bilirubin 0.4  0.3 - 1.2 mg/dL   Alkaline Phosphatase 53  39 - 117 U/L   AST 16  0 - 37 U/L   ALT 15  0 - 35 U/L   Total Protein 6.7  6.0 -  8.3 g/dL   Albumin 4.5  3.5 - 5.2 g/dL   Calcium 9.4  8.4 - 16.1 mg/dL  HEPATITIS C ANTIBODY     Status: None   Collection Time    09/02/13  8:17 AM      Result Value Range   HCV Ab NEGATIVE  NEGATIVE  TSH     Status: None   Collection Time    09/02/13  8:17 AM      Result Value Range   TSH 2.895  0.350 - 4.500 uIU/mL  LIPID PANEL     Status: None   Collection Time    09/02/13  8:17 AM      Result Value Range   Cholesterol 149  0 - 200 mg/dL   Comment: ATP III Classification:           < 200        mg/dL        Desirable          200 - 239     mg/dL        Borderline High          >= 240        mg/dL        High         Triglycerides 93  <150 mg/dL   HDL 49  >09 mg/dL   Total CHOL/HDL Ratio 3.0     VLDL 19  0 - 40 mg/dL   LDL Cholesterol 81  0 - 99 mg/dL   Comment:       Total Cholesterol/HDL Ratio:CHD Risk                            Coronary Heart Disease Risk Table                                            Men       Women              1/2 Average Risk              3.4        3.3                  Average Risk              5.0        4.4               2X Average Risk              9.6        7.1               3X Average Risk             23.4       11.0     Use the calculated Patient Ratio above and the CHD Risk table      to determine the patient's CHD Risk.     ATP III Classification (LDL):           < 100        mg/dL         Optimal          100 - 129     mg/dL  Near or Above Optimal          130 - 159     mg/dL         Borderline High          160 - 189     mg/dL         High           > 190        mg/dL         Very High        PATHOLOGIST SMEAR REVIEW     Status: None   Collection Time     09/02/13  8:17 AM      Result Value Range   Path Review       Comment: Absolute lymphocytosis and atypical lymphocytes. Suggest     immunophenotyping by flow cytometry if a new/persistent finding.     Reviewed by Nehemiah Massed Mammarappallil MD (Electronic Signature on File)     09/03/2013  POCT URINALYSIS DIPSTICK     Status: None   Collection Time    09/02/13  2:09 PM      Result Value Range   Color, UA yellow     Clarity, UA clear     Glucose, UA neg     Bilirubin, UA neg     Ketones, UA neg     Spec Grav, UA 1.015     Blood, UA neg     pH, UA 5.5     Protein, UA neg     Urobilinogen, UA 0.2     Nitrite, UA neg     Leukocytes, UA small (1+)    POCT UA - MICROSCOPIC ONLY     Status: None   Collection Time    09/02/13  2:10 PM      Result Value Range   WBC, Ur, HPF, POC 0-4     RBC, urine, microscopic neg     Bacteria, U Microscopic trace     Mucus, UA neg     Epithelial cells, urine per micros 0-6     Crystals, Ur, HPF, POC neg     Casts, Ur, LPF, POC neg     Yeast, UA neg    IMMUNOPHENOTYPING BY FLOW CYTOMETRY     Status: None   Collection Time    09/09/13  8:25 AM      Result Value Range   Viability 94     Comment:     Phenotype       Comment: POSITIVE: Kappa light chain,CD19,CD20(dim),CD5,CD23     NEGATIVE: Lambda light chain, CD10,FMC-7   Interpretation:       Comment: Immunophenotyping reveals a monoclonal kappa-positive B-cell     population comprising approximately 58% of total lymphocytes and 44%     of total WBCs. The phenotype is consistent with a diagnosis of B-cell     lymphocytic leukemia (CLL) / small lymphocytic leukemia (SLL).     Immunophenotyping showed approximately 21% myeloid cells, 76%     lymphocytes and 3% monocytes.   Lymphocyte subsets include 58% B     cells, 1% NK cells and 39% T cells of which 29% are     CD4-positive T helper cells, and 10% are CD8-positive T suppressor     cells.             Antigens tested in the Lymphoid Panel  include:     CD2,CD3,CD4,CD5,CD7,CD8,CD10(alb1),CD11c,CD19,CD20,CD22,CD23,CD38,     CD45,CD56,FMC7,KAPPA light chains and LAMBDA  light chains.     Reviewed by Nehemiah Massed Mammarappallil MD (Electronic Signature on File)     The performance characteristics of this test were determined by     [facility]. It has not been cleared or approved by the     [place] Food and Drug Administration (FDA). The FDA has determined that     such clearance or approval is not necessary. This test is used for     clinical purposes. It should not be regarded as investigational or for     research. This laboratory is certified under the Clinical Laboratory     Improvement Amendments of 1988 (CLIA-88) as qualified to perform high     complexity testing.        REFLEX BLAST SCREEN     Status: None   Collection Time    09/09/13  8:25 AM      Result Value Range   Interpretation:       Comment: See Immunophenotyping Interpretation above.           Antigens tested in the blast panel include: CD33,CD34,CD117,and     HLADR.           The performance characteristics of this test were determined by     [facility]. It has not been cleared or approved by the     [place] Food and Drug Administration (FDA). The FDA has determined that     such clearance or approval is not necessary. This test is used for     clinical purposes. It should not be regarded as investigational or for     research. This laboratory is certified under the Clinical Laboratory     Improvement Amendments of 1988 (CLIA-88) as qualified to perform high     complexity testing.          ASSESSMENT:  CLL, Rai stage 0  PLAN:  I spent a lot of time today educating the patient about CLL. I discussed with her & family the natural history of CLL. Currently she is at Rai stage 0 and I recommend observation only as the patient is not symptomatic. I educated her about signs and symptoms to watch out for possible disease progression such as  unexplained lymphadenopathy, weight loss, night sweats, recurrent infection and if she has any of those symptoms she will call me to be seen sooner. With her next visit, I like to see her back in 3 months with history, physical examination and blood work, including a FISH panel to understand and behavior of her CLL. I recommend pneumonia vaccination, will administer in her next visit. I also recommend dermatology review due to risk of skin cancer. I also recommend vitamin D supplements. Orders Placed This Encounter  Procedures  . CBC with Differential    Standing Status: Future     Number of Occurrences:      Standing Expiration Date: 06/07/2014  . Comprehensive metabolic panel    Standing Status: Future     Number of Occurrences:      Standing Expiration Date: 09/15/2014  . Lactate dehydrogenase    Standing Status: Future     Number of Occurrences:      Standing Expiration Date: 09/15/2014  . IgG, IgA, IgM    Standing Status: Future     Number of Occurrences:      Standing Expiration Date: 09/15/2014  . FISH, Peripheral Blood    FISH CLL    Standing Status: Future  Number of Occurrences:      Standing Expiration Date: 09/15/2014  . Direct antiglobulin test    Standing Status: Future     Number of Occurrences:      Standing Expiration Date: 09/15/2014    All questions were answered. The patient knows to call the clinic with any problems, questions or concerns.    Jesika Men, MD 09/15/2013 11:38 AM

## 2013-09-17 ENCOUNTER — Encounter: Payer: Self-pay | Admitting: Physician Assistant

## 2013-09-17 NOTE — Telephone Encounter (Signed)
sw pt gv appt for 12/16/13@ 2:45p. Pt is aware the she needs to call and make an appt w/ derm due to shes an established pt already...td

## 2013-09-20 ENCOUNTER — Ambulatory Visit: Payer: BC Managed Care – PPO

## 2013-09-20 ENCOUNTER — Ambulatory Visit: Payer: BC Managed Care – PPO | Admitting: Hematology and Oncology

## 2013-09-25 ENCOUNTER — Ambulatory Visit (INDEPENDENT_AMBULATORY_CARE_PROVIDER_SITE_OTHER): Payer: BC Managed Care – PPO | Admitting: Family Medicine

## 2013-09-25 VITALS — BP 130/80 | HR 81 | Temp 98.8°F | Resp 16 | Ht 66.0 in | Wt 155.0 lb

## 2013-09-25 DIAGNOSIS — R059 Cough, unspecified: Secondary | ICD-10-CM

## 2013-09-25 DIAGNOSIS — K625 Hemorrhage of anus and rectum: Secondary | ICD-10-CM

## 2013-09-25 DIAGNOSIS — R05 Cough: Secondary | ICD-10-CM

## 2013-09-25 LAB — POCT CBC
GRANULOCYTE PERCENT: 39 % (ref 37–80)
HEMATOCRIT: 43 % (ref 37.7–47.9)
Hemoglobin: 13.2 g/dL (ref 12.2–16.2)
Lymph, poc: 8 — AB (ref 0.6–3.4)
MCH, POC: 30.1 pg (ref 27–31.2)
MCHC: 30.7 g/dL — AB (ref 31.8–35.4)
MCV: 98.1 fL — AB (ref 80–97)
MID (cbc): 0.6 (ref 0–0.9)
MPV: 10.1 fL (ref 0–99.8)
PLATELET COUNT, POC: 208 10*3/uL (ref 142–424)
POC GRANULOCYTE: 5.5 (ref 2–6.9)
POC LYMPH %: 57 % — AB (ref 10–50)
POC MID %: 4 % (ref 0–12)
RBC: 4.38 M/uL (ref 4.04–5.48)
RDW, POC: 13.2 %
WBC: 14.1 10*3/uL — AB (ref 4.6–10.2)

## 2013-09-25 MED ORDER — HYDROCORTISONE ACETATE 25 MG RE SUPP: 25.0000 mg | Freq: Two times a day (BID) | RECTAL | Status: DC

## 2013-09-25 MED ORDER — HYDROCODONE-HOMATROPINE 5-1.5 MG/5ML PO SYRP: 5.0000 mL | ORAL_SOLUTION | Freq: Three times a day (TID) | ORAL | Status: DC | PRN

## 2013-09-25 NOTE — Progress Notes (Signed)
[facility] [address] [place] [address] [phone]  Date:  09/25/2013   Name:  Kelly [name]   DOB:  12/19/1955   MRN:  [id number]  PCP:  JEFFERY,CHELLE, PA-C    Chief Complaint: Rectal Bleeding   History of Present Illness:  Kelly [name] is a 58 y.o. very pleasant female patient who presents with the following:  Here today with a rectal bleeding concern. She had a BM this afternoon- she noted blood in the toilet bowl and some on the tissue when she wiped as well.  She did not feel like she had to strain, and did not have any particular pain with the BM. She was concerned by the amount of blood in the toilet.  She returned to sit on the toilet a couple more times and had more bleeding at which time she came in to clinic.    She was just dx with CLL a week ago and is worried that this could have something to do with her cancer dx,  Her oncologist is Dr. Alvy Bimler. The current plan is to follow her medically. She will see oncology agin in about 3 months.    She has not noted any blood from her gums, no nose bleeds.   She has not noted any blood in her urine.   She has been eating well, no fever.    She has never had rectal bleeding in the past.  Her most recent colonoscopy was at age 16.  No family history of colon cancer.   She has seen Dr. Earlean Shawl for GI- however she will need to change to someone else due to insurance change   Sh has noted a cough for a couple of weeks- she otherwise feels ok.  She has not noted a fever. However tessalon perles have not controlled her cough especially at night. She is interested in using something else for cough if possible.  Patient Active Problem List   Diagnosis Date Noted  . CLL (chronic lymphocytic leukemia) 09/15/2013  . Leukocytosis, unspecified 09/14/2013  . Anxiety 11/25/2012  . Hypothyroidism 11/25/2012  . Allergic rhinitis 11/25/2012  . Essential hypertension, benign 04/24/2012  . Palpitations  04/24/2012  . Pure hypercholesterolemia 04/24/2012    Past Medical History  Diagnosis Date  . Thyroid disease   . Anxiety   . Allergy   . Hypertension   . Hyperlipidemia   . Leukocytosis, unspecified 09/14/2013  . CLL (chronic lymphocytic leukemia) 09/15/2013    Past Surgical History  Procedure Laterality Date  . Breast surgery Right     benign lesion    History  Substance Use Topics  . Smoking status: Never Smoker   . Smokeless tobacco: Never Used  . Alcohol Use: Yes     Comment: rarely    Family History  Problem Relation Age of Onset  . Cancer Mother 54    pancreatic cancer  . Cancer Father 51    lung cancer; +tobacco    No Known Allergies  Medication list has been reviewed and updated.  Current Outpatient Prescriptions on File Prior to Visit  Medication Sig Dispense Refill  . ALPRAZolam (XANAX) 0.25 MG tablet Take 1 tablet (0.25 mg total) by mouth at bedtime as needed.  30 tablet  1  . estradiol (VIVELLE-DOT) 0.1 MG/24HR Place 0.5 patches onto the skin 2 (two) times a week.       . levothyroxine (SYNTHROID, LEVOTHROID) 50 MCG tablet Take 1 tablet (50 mcg  total) by mouth daily.  90 tablet  1  . lisinopril (PRINIVIL,ZESTRIL) 5 MG tablet Take 1 tablet (5 mg total) by mouth daily.  90 tablet  1  . progesterone (PROMETRIUM) 100 MG capsule Take 100 mg by mouth daily.      . rosuvastatin (CRESTOR) 10 MG tablet Take 0.5 tablets (5 mg total) by mouth daily. 1/2 tab qd  90 tablet  1   No current facility-administered medications on file prior to visit.    Review of Systems:  As per HPI- otherwise negative.   Physical Examination: Filed Vitals:   09/25/13 1710  BP: 130/80  Pulse: 81  Temp: 98.8 F (37.1 C)  Resp: 16   Filed Vitals:   09/25/13 1710  Height: 5\' 6"  (1.676 m)  Weight: 155 lb (70.308 kg)   Body mass index is 25.03 kg/(m^2). Ideal Body Weight: Weight in (lb) to have BMI = 25: 154.6  GEN: WDWN, NAD, Non-toxic, A & O x 3, looks well HEENT:  Atraumatic, Normocephalic. Neck supple. No masses, No LAD.  Bilateral TM wnl, oropharynx normal.  PEERL,EOMI.   Ears and Nose: No external deformity. CV: RRR, No M/G/R. No JVD. No thrill. No extra heart sounds. PULM: CTA B, no wheezes, crackles, rhonchi. No retractions. No resp. distress. No accessory muscle use. ABD: S, NT, ND, +BS. No rebound. No HSM.  Benign exam EXTR: No c/c/e NEURO Normal gait.  PSYCH: Normally interactive. Conversant. Not depressed or anxious appearing.  Calm demeanor.  Rectal: she has external and internal hemorrhoids.  There is blood present around the anus but anoscope exam does not reveal any bleeding from deeper in her rectum.  Suspect that one of her hemorrhoids was bleeding, although there is no acute fleeing visible.  No thrombosed hemorrhoid or pain on exam.   Results for orders placed in visit on 09/25/13  POCT CBC      Result Value Range   WBC 14.1 (*) 4.6 - 10.2 K/uL   Lymph, poc 8.0 (*) 0.6 - 3.4   POC LYMPH PERCENT 57.0 (*) 10 - 50 %L   MID (cbc) 0.6  0 - 0.9   POC MID % 4.0  0 - 12 %M   POC Granulocyte 5.5  2 - 6.9   Granulocyte percent 39.0  37 - 80 %G   RBC 4.38  4.04 - 5.48 M/uL   Hemoglobin 13.2  12.2 - 16.2 g/dL   HCT, POC 43.0  37.7 - 47.9 %   MCV 98.1 (*) 80 - 97 fL   MCH, POC 30.1  27 - 31.2 pg   MCHC 30.7 (*) 31.8 - 35.4 g/dL   RDW, POC 13.2     Platelet Count, POC 208  142 - 424 K/uL   MPV 10.1  0 - 99.8 fL   Last CBC 3 weeks ago showed Hg 13.6 and Hct 39.2 Assessment and Plan: Rectal bleeding - Plan: POCT CBC, hydrocortisone (ANUSOL-HC) 25 MG suppository, Ambulatory referral to Gastroenterology  Cough - Plan: HYDROcodone-homatropine (HYCODAN) 5-1.5 MG/5ML syrup  Rectal bleeding, likely from a hemorrhoid.  At this time her bleeding has stopped.  Cautioned about return of bleeding and signs of dangerous bleeding taht should prompt her to go to the ER.  Her last colonoscopy was more than 5 years ago so will go ahead and refer back to  GI.  Another screening colonoscopy may be indicated.  Will use anusol suppositories and colace for the next few days  Cough, persistent.  Otherwise  she feels well.  Hycodan to use prn for cough.  Cautioned regarding sedation.    See patient instructions for more details.     Signed Lamar Blinks, MD

## 2013-09-25 NOTE — Patient Instructions (Signed)
Work on avoiding constipation- try colace OTC which you can take once or twice a day. Drink plenty of water.  Try not to strain when you have a BM Try the suppositories twice a day for the next couple of days.  We will move up your next screening colonoscopy.  I will arrange for you to see [facility] IF you develop any persistent or severe bleeding go to the ER for urgent evaluation Try the hycodan cough syrup as needed for cough- remember it can make your drowsy.  I would not combine this with your xanax unless absolutely necessary

## 2013-09-26 ENCOUNTER — Other Ambulatory Visit: Payer: Self-pay | Admitting: Family Medicine

## 2013-09-27 ENCOUNTER — Telehealth: Payer: Self-pay | Admitting: *Deleted

## 2013-09-27 NOTE — Telephone Encounter (Signed)
No note

## 2013-10-07 ENCOUNTER — Encounter: Payer: Self-pay | Admitting: Gastroenterology

## 2013-10-21 ENCOUNTER — Encounter: Payer: Self-pay | Admitting: *Deleted

## 2013-10-27 ENCOUNTER — Ambulatory Visit (INDEPENDENT_AMBULATORY_CARE_PROVIDER_SITE_OTHER): Payer: BC Managed Care – PPO | Admitting: Gastroenterology

## 2013-10-27 ENCOUNTER — Encounter: Payer: Self-pay | Admitting: Gastroenterology

## 2013-10-27 VITALS — BP 119/68 | HR 89 | Ht 66.0 in | Wt 158.4 lb

## 2013-10-27 DIAGNOSIS — K648 Other hemorrhoids: Secondary | ICD-10-CM

## 2013-10-27 DIAGNOSIS — K644 Residual hemorrhoidal skin tags: Secondary | ICD-10-CM

## 2013-10-27 DIAGNOSIS — K921 Melena: Secondary | ICD-10-CM

## 2013-10-27 MED ORDER — PEG-KCL-NACL-NASULF-NA ASC-C 100 G PO SOLR: 1.0000 | Freq: Once | ORAL | Status: DC

## 2013-10-27 NOTE — Patient Instructions (Signed)
You have been scheduled for a colonoscopy with propofol. Please follow written instructions given to you at your visit today.  Please pick up your prep kit at the pharmacy within the next 1-3 days. If you use inhalers (even only as needed), please bring them with you on the day of your procedure. Your physician has requested that you go to www.startemmi.com and enter the access code given to you at your visit today. This web site gives a general overview about your procedure. However, you should still follow specific instructions given to you by our office regarding your preparation for the procedure.  Thank you for choosing me and [facility].  Malcolm T. Stark, Jr., MD., FACG  

## 2013-10-27 NOTE — Progress Notes (Signed)
History of Present Illness: This is a 58 year old female developed rectal bleeding in early January. Evaluated by Dr. Lorelei Pont and anoscopy was performed showing internal and external hemorrhoids with small amounts of bright red blood associated. She was treated with hydrocortisone suppositories and her symptoms abated. She notes occasional episodes of constipation and states she had constipation around the time when her rectal bleeding started. She previously underwent: colonoscopy by Dr. Earlean Shawl on 05/11/2007 showing internal hemorrhoids and no other abnormalities. Denies weight loss, abdominal pain, diarrhea, change in stool caliber, melena, nausea, vomiting, dysphagia, reflux symptoms, chest pain.  No Known Allergies Outpatient Prescriptions Prior to Visit  Medication Sig Dispense Refill  . ALPRAZolam (XANAX) 0.25 MG tablet Take 1 tablet (0.25 mg total) by mouth at bedtime as needed.  30 tablet  1  . estradiol (VIVELLE-DOT) 0.1 MG/24HR Place 0.5 patches onto the skin 2 (two) times a week.       Marland Kitchen HYDROcodone-homatropine (HYCODAN) 5-1.5 MG/5ML syrup Take 5 mLs by mouth every 8 (eight) hours as needed for cough.  90 mL  0  . hydrocortisone (ANUSOL-HC) 25 MG suppository Place 1 suppository (25 mg total) rectally 2 (two) times daily.  12 suppository  0  . levothyroxine (SYNTHROID, LEVOTHROID) 50 MCG tablet Take 1 tablet (50 mcg total) by mouth daily.  90 tablet  1  . lisinopril (PRINIVIL,ZESTRIL) 5 MG tablet Take 1 tablet (5 mg total) by mouth daily.  90 tablet  1  . progesterone (PROMETRIUM) 100 MG capsule Take 100 mg by mouth daily.      . rosuvastatin (CRESTOR) 10 MG tablet Take 0.5 tablets (5 mg total) by mouth daily. 1/2 tab qd  90 tablet  1   No facility-administered medications prior to visit.   Past Medical History  Diagnosis Date  . Thyroid disease   . Anxiety   . Allergy   . Hypertension   . Hyperlipidemia   . Leukocytosis, unspecified 09/14/2013  . CLL (chronic lymphocytic  leukemia) 09/15/2013  . Internal hemorrhoids 05/11/07    Dr Earlean Shawl   Past Surgical History  Procedure Laterality Date  . Breast surgery Right     benign lesion   History   Social History  . Marital Status: Divorced    Spouse Name: n/a    Number of Children: 2  . Years of Education: 13+   Occupational History  . accountant   .     Social History Main Topics  . Smoking status: Never Smoker   . Smokeless tobacco: Never Used  . Alcohol Use: Yes     Comment: rarely  . Drug Use: No  . Sexual Activity: No   Other Topics Concern  . None   Social History Narrative   Lives alone.  Her adult children live nearby.   Family History  Problem Relation Age of Onset  . Cancer Mother 32    pancreatic cancer  . Cancer Father 72    lung cancer; +tobacco     Review of Systems: Pertinent positive and negative review of systems were noted in the above HPI section. All other review of systems were otherwise negative.  Physical Exam: General: Well developed , well nourished, no acute distress Head: Normocephalic and atraumatic Eyes:  sclerae anicteric, EOMI Ears: Normal auditory acuity Mouth: No deformity or lesions Neck: Supple, no masses or thyromegaly Lungs: Clear throughout to auscultation Heart: Regular rate and rhythm; no murmurs, rubs or bruits Abdomen: Soft, non tender and non distended. No masses, hepatosplenomegaly  or hernias noted. Normal Bowel sounds Rectal: deferred to colonoscopy, anoscopy on 09/25/13 by Dr. Lorelei Pont showed internal/external hemorrhoids with some bleeding Musculoskeletal: Symmetrical with no gross deformities  Skin: No lesions on visible extremities Pulses:  Normal pulses noted Extremities: No clubbing, cyanosis, edema or deformities noted Neurological: Alert oriented x 4, grossly nonfocal Cervical Nodes:  No significant cervical adenopathy Inguinal Nodes: No significant inguinal adenopathy Psychological:  Alert and cooperative. Normal mood and  affect  Assessment and Recommendations:  1. Hematochezia likely secondary to internal and external hemorrhoids. Continue hydrocortisone suppositories daily as needed standard rectal care instructions. R/O colorectal neoplasms and other disorders. Schedule colonoscopy. The risks, benefits, and alternatives to colonoscopy with possible biopsy, possible internal hemorrhoidal injection and possible polypectomy were discussed with the patient and they consent to proceed. 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  2. Constipation. Increase dietary fiber and water intake. Continue Colace. Add MiraLax once or twice daily if the above regimen is not effective.

## 2013-10-28 ENCOUNTER — Encounter: Payer: Self-pay | Admitting: Gastroenterology

## 2013-11-26 ENCOUNTER — Encounter: Payer: Self-pay | Admitting: Hematology and Oncology

## 2013-12-01 ENCOUNTER — Telehealth: Payer: Self-pay | Admitting: Hematology and Oncology

## 2013-12-01 NOTE — Telephone Encounter (Signed)
added lb for 3/18. 3/26 f/u appt remains the same. lmonvm for pt and mailed new schedule.

## 2013-12-08 ENCOUNTER — Other Ambulatory Visit (HOSPITAL_BASED_OUTPATIENT_CLINIC_OR_DEPARTMENT_OTHER): Payer: BC Managed Care – PPO

## 2013-12-08 ENCOUNTER — Other Ambulatory Visit (HOSPITAL_COMMUNITY)
Admission: RE | Admit: 2013-12-08 | Discharge: 2013-12-08 | Disposition: A | Discharge status: Home / Self Care | Payer: BC Managed Care – PPO | Source: Ambulatory Visit | Attending: Hematology and Oncology | Admitting: Hematology and Oncology

## 2013-12-08 DIAGNOSIS — C9110 Chronic lymphocytic leukemia of B-cell type not having achieved remission: Secondary | ICD-10-CM

## 2013-12-08 LAB — COMPREHENSIVE METABOLIC PANEL (CC13)
ALBUMIN: 4 g/dL (ref 3.5–5.0)
ALK PHOS: 47 U/L (ref 40–150)
ALT: 10 U/L (ref 0–55)
ANION GAP: 9 meq/L (ref 3–11)
AST: 14 U/L (ref 5–34)
BUN: 18.7 mg/dL (ref 7.0–26.0)
CHLORIDE: 104 meq/L (ref 98–109)
CO2: 26 meq/L (ref 22–29)
Calcium: 9.4 mg/dL (ref 8.4–10.4)
Creatinine: 0.9 mg/dL (ref 0.6–1.1)
GLUCOSE: 82 mg/dL (ref 70–140)
POTASSIUM: 4.1 meq/L (ref 3.5–5.1)
SODIUM: 139 meq/L (ref 136–145)
Total Bilirubin: 0.35 mg/dL (ref 0.20–1.20)
Total Protein: 6.8 g/dL (ref 6.4–8.3)

## 2013-12-08 LAB — CBC WITH DIFFERENTIAL/PLATELET
BASO%: 0.4 % (ref 0.0–2.0)
Basophils Absolute: 0.1 10*3/uL (ref 0.0–0.1)
EOS ABS: 0.2 10*3/uL (ref 0.0–0.5)
EOS%: 1 % (ref 0.0–7.0)
HCT: 39 % (ref 34.8–46.6)
HGB: 12.9 g/dL (ref 11.6–15.9)
LYMPH#: 10.7 10*3/uL — AB (ref 0.9–3.3)
LYMPH%: 70.8 % — ABNORMAL HIGH (ref 14.0–49.7)
MCH: 30.9 pg (ref 25.1–34.0)
MCHC: 33.2 g/dL (ref 31.5–36.0)
MCV: 93.1 fL (ref 79.5–101.0)
MONO#: 0.5 10*3/uL (ref 0.1–0.9)
MONO%: 3.3 % (ref 0.0–14.0)
NEUT%: 24.5 % — ABNORMAL LOW (ref 38.4–76.8)
NEUTROS ABS: 3.7 10*3/uL (ref 1.5–6.5)
Platelets: 192 10*3/uL (ref 145–400)
RBC: 4.19 10*6/uL (ref 3.70–5.45)
RDW: 13 % (ref 11.2–14.5)
WBC: 15.1 10*3/uL — AB (ref 3.9–10.3)

## 2013-12-08 LAB — LACTATE DEHYDROGENASE (CC13): LDH: 136 U/L (ref 125–245)

## 2013-12-08 LAB — TECHNOLOGIST REVIEW

## 2013-12-09 LAB — DIRECT ANTIGLOBULIN TEST (NOT AT ARMC)
DAT (COMPLEMENT): NEGATIVE
DAT IGG: NEGATIVE

## 2013-12-09 LAB — IGG, IGA, IGM
IGG (IMMUNOGLOBIN G), SERUM: 991 mg/dL (ref 690–1700)
IGM, SERUM: 28 mg/dL — AB (ref 52–322)
IgA: 109 mg/dL (ref 69–380)

## 2013-12-16 ENCOUNTER — Ambulatory Visit (HOSPITAL_BASED_OUTPATIENT_CLINIC_OR_DEPARTMENT_OTHER): Payer: BC Managed Care – PPO | Admitting: Hematology and Oncology

## 2013-12-16 ENCOUNTER — Encounter: Payer: Self-pay | Admitting: Hematology and Oncology

## 2013-12-16 VITALS — BP 137/69 | HR 82 | Temp 98.0°F | Resp 16 | Wt 158.3 lb

## 2013-12-16 DIAGNOSIS — Z23 Encounter for immunization: Secondary | ICD-10-CM | POA: Insufficient documentation

## 2013-12-16 DIAGNOSIS — C9110 Chronic lymphocytic leukemia of B-cell type not having achieved remission: Secondary | ICD-10-CM

## 2013-12-16 MED ORDER — PNEUMOCOCCAL VAC POLYVALENT 25 MCG/0.5ML IJ INJ
0.5000 mL | INJECTION | Freq: Once | INTRAMUSCULAR | Status: AC
  Administered 2013-12-16: 0.5 mL via INTRAMUSCULAR
  Filled 2013-12-16: qty 0.5

## 2013-12-16 NOTE — Progress Notes (Signed)
[facility] OFFICE PROGRESS NOTE  Patient Care Team: Fara Chute, PA-C as PCP - General (Physician Assistant) West Pugh, NP as Nurse Practitioner (Gynecology) Heath Lark, MD as Consulting Physician (Hematology and Oncology)  DIAGNOSIS: CLL, Rai stage 0, on observation  SUMMARY OF ONCOLOGIC HISTORY: Oncology History   Rai Stage 0     CLL (chronic lymphocytic leukemia)   09/14/2013 Procedure Flow cytometry confirmed diagnosis of CLL. The monoclonal kappa positive B cell stained positive for CD5, CD19, CD20 and CD23 and negative for lambda light chain, CD10 and FMC-7   09/15/2013 Initial Diagnosis CLL (chronic lymphocytic leukemia)    INTERVAL HISTORY: Kelly [name] 58 y.o. female returns for further followup. She is feeling well. Denies any new lymphadenopathy. She denies any recent fever, chills, night sweats or abnormal weight loss She saw a dermatologist with no abnormal skin lesions noted. I have reviewed the past medical history, past surgical history, social history and family history with the patient and they are unchanged from previous note.  ALLERGIES:  has No Known Allergies.  MEDICATIONS:  Current Outpatient Prescriptions  Medication Sig Dispense Refill  . ALPRAZolam (XANAX) 0.25 MG tablet Take 1 tablet (0.25 mg total) by mouth at bedtime as needed.  30 tablet  1  . aspirin 81 MG tablet Take 81 mg by mouth daily.      . cholecalciferol (VITAMIN D) 1000 UNITS tablet Take 1,000 Units by mouth daily.      Marland Kitchen estradiol (VIVELLE-DOT) 0.1 MG/24HR Place 0.5 patches onto the skin 2 (two) times a week.       . levothyroxine (SYNTHROID, LEVOTHROID) 50 MCG tablet Take 1 tablet (50 mcg total) by mouth daily.  90 tablet  1  . lisinopril (PRINIVIL,ZESTRIL) 5 MG tablet Take 1 tablet (5 mg total) by mouth daily.  90 tablet  1  . progesterone (PROMETRIUM) 100 MG capsule Take 100 mg by mouth daily.      . rosuvastatin (CRESTOR) 10 MG tablet Take 0.5  tablets (5 mg total) by mouth daily. 1/2 tab qd  90 tablet  1  . peg 3350 powder (MOVIPREP) 100 G SOLR Take 1 kit (200 g total) by mouth once.  1 kit  0   Current Facility-Administered Medications  Medication Dose Route Frequency Provider Last Rate Last Dose  . pneumococcal 23 valent vaccine (PNU-IMMUNE) injection 0.5 mL  0.5 mL Intramuscular Tomorrow-1000 Heath Lark, MD        REVIEW OF SYSTEMS:   Constitutional: Denies fevers, chills or abnormal weight loss Eyes: Denies blurriness of vision Ears, nose, mouth, throat, and face: Denies mucositis or sore throat Respiratory: Denies cough, dyspnea or wheezes Cardiovascular: Denies palpitation, chest discomfort or lower extremity swelling Gastrointestinal:  Denies nausea, heartburn or change in bowel habits Skin: Denies abnormal skin rashes Lymphatics: Denies new lymphadenopathy or easy bruising Neurological:Denies numbness, tingling or new weaknesses Behavioral/Psych: Mood is stable, no new changes  All other systems were reviewed with the patient and are negative.  PHYSICAL EXAMINATION: ECOG PERFORMANCE STATUS: 0 - Asymptomatic  Filed Vitals:   12/16/13 1317  BP: 137/69  Pulse: 82  Temp: 98 F (36.7 C)  Resp: 16   Filed Weights   12/16/13 1317  Weight: 158 lb 4.8 oz (71.804 kg)    GENERAL:alert, no distress and comfortable SKIN: skin color, texture, turgor are normal, no rashes or significant lesions EYES: normal, Conjunctiva are pink and non-injected, sclera clear OROPHARYNX:no exudate, no erythema and lips, buccal mucosa, and  tongue normal  NECK: supple, thyroid normal size, non-tender, without nodularity LYMPH:  no palpable lymphadenopathy in the cervical, axillary or inguinal LUNGS: clear to auscultation and percussion with normal breathing effort HEART: regular rate & rhythm and no murmurs and no lower extremity edema ABDOMEN:abdomen soft, non-tender and normal bowel sounds Musculoskeletal:no cyanosis of digits and no  clubbing  NEURO: alert & oriented x 3 with fluent speech, no focal motor/sensory deficits  LABORATORY DATA:  I have reviewed the data as listed    Component Value Date/Time   NA 139 12/08/2013 1341   NA 135 09/02/2013 0817   K 4.1 12/08/2013 1341   K 4.1 09/02/2013 0817   CL 101 09/02/2013 0817   CO2 26 12/08/2013 1341   CO2 29 09/02/2013 0817   GLUCOSE 82 12/08/2013 1341   GLUCOSE 87 09/02/2013 0817   BUN 18.7 12/08/2013 1341   BUN 14 09/02/2013 0817   CREATININE 0.9 12/08/2013 1341   CREATININE 0.83 09/02/2013 0817   CREATININE 0.9 08/28/2010 0650   CALCIUM 9.4 12/08/2013 1341   CALCIUM 9.4 09/02/2013 0817   PROT 6.8 12/08/2013 1341   PROT 6.7 09/02/2013 0817   ALBUMIN 4.0 12/08/2013 1341   ALBUMIN 4.5 09/02/2013 0817   AST 14 12/08/2013 1341   AST 16 09/02/2013 0817   ALT 10 12/08/2013 1341   ALT 15 09/02/2013 0817   ALKPHOS 47 12/08/2013 1341   ALKPHOS 53 09/02/2013 0817   BILITOT 0.35 12/08/2013 1341   BILITOT 0.4 09/02/2013 0817    No results found for this basename: SPEP, UPEP,  kappa and lambda light chains    Lab Results  Component Value Date   WBC 15.1* 12/08/2013   NEUTROABS 3.7 12/08/2013   HGB 12.9 12/08/2013   HCT 39.0 12/08/2013   MCV 93.1 12/08/2013   PLT 192 12/08/2013      Chemistry      Component Value Date/Time   NA 139 12/08/2013 1341   NA 135 09/02/2013 0817   K 4.1 12/08/2013 1341   K 4.1 09/02/2013 0817   CL 101 09/02/2013 0817   CO2 26 12/08/2013 1341   CO2 29 09/02/2013 0817   BUN 18.7 12/08/2013 1341   BUN 14 09/02/2013 0817   CREATININE 0.9 12/08/2013 1341   CREATININE 0.83 09/02/2013 0817   CREATININE 0.9 08/28/2010 0650      Component Value Date/Time   CALCIUM 9.4 12/08/2013 1341   CALCIUM 9.4 09/02/2013 0817   ALKPHOS 47 12/08/2013 1341   ALKPHOS 53 09/02/2013 0817   AST 14 12/08/2013 1341   AST 16 09/02/2013 0817   ALT 10 12/08/2013 1341   ALT 15 09/02/2013 0817   BILITOT 0.35 12/08/2013 1341   BILITOT 0.4 09/02/2013 0817       ASSESSMENT & PLAN:  #1 CLL She has no signs of disease progression. Fish analysis is pending. I continue to reinforce importance of vaccination. I will proceed to give her pneumonia vaccination today. I educated the patient and family signs and symptoms to watch out for possible disease progression. Due to stability of her leukocytosis, I will just see her on a yearly basis.  Orders Placed This Encounter  Procedures  . CBC with Differential    Standing Status: Future     Number of Occurrences:      Standing Expiration Date: 12/16/2014  . Comprehensive metabolic panel    Standing Status: Future     Number of Occurrences:      Standing Expiration Date: 12/16/2014  .  Lactate dehydrogenase    Standing Status: Future     Number of Occurrences:      Standing Expiration Date: 12/16/2014   All questions were answered. The patient knows to call the clinic with any problems, questions or concerns. No barriers to learning was detected. I spent 15 minutes counseling the patient face to face. The total time spent in the appointment was 20 minutes and more than 50% was on counseling and review of test results     [facility], [place], MD 12/16/2013 1:32 PM

## 2013-12-17 ENCOUNTER — Telehealth: Payer: Self-pay | Admitting: Hematology and Oncology

## 2013-12-17 NOTE — Telephone Encounter (Signed)
s.w. pt and advised on March 2016 appt....pt ok adn aware °

## 2013-12-30 LAB — TISSUE HYBRIDIZATION TO NCBH

## 2014-01-10 ENCOUNTER — Other Ambulatory Visit: Payer: Self-pay | Admitting: Physician Assistant

## 2014-01-11 LAB — FISH, PERIPHERAL BLOOD

## 2014-01-18 ENCOUNTER — Encounter: Payer: Self-pay | Admitting: Gastroenterology

## 2014-01-18 ENCOUNTER — Ambulatory Visit (AMBULATORY_SURGERY_CENTER): Payer: BC Managed Care – PPO | Admitting: Gastroenterology

## 2014-01-18 VITALS — BP 127/75 | HR 62 | Temp 99.1°F | Resp 25 | Ht 66.0 in | Wt 158.0 lb

## 2014-01-18 DIAGNOSIS — K648 Other hemorrhoids: Secondary | ICD-10-CM

## 2014-01-18 DIAGNOSIS — K921 Melena: Secondary | ICD-10-CM

## 2014-01-18 MED ORDER — SODIUM CHLORIDE 0.9 % IV SOLN: 500.0000 mL | INTRAVENOUS | Status: DC

## 2014-01-18 NOTE — Progress Notes (Signed)
Report to pacu rn, vss, bbs=clear 

## 2014-01-18 NOTE — Progress Notes (Signed)
Called to room to assist during endoscopic procedure.  Patient ID and intended procedure confirmed with present staff. Received instructions for my participation in the procedure from the performing physician.  

## 2014-01-18 NOTE — Patient Instructions (Signed)
Discharge instructions given with verbal understanding. Handouts on diverticulosis and hemorrhoids. Resume previous medications. YOU HAD AN ENDOSCOPIC PROCEDURE TODAY AT THE [facility]: Refer to the procedure report that was given to you for any specific questions about what was found during the examination.  If the procedure report does not answer your questions, please call your gastroenterologist to clarify.  If you requested that your care partner not be given the details of your procedure findings, then the procedure report has been included in a sealed envelope for you to review at your convenience later.  YOU SHOULD EXPECT: Some feelings of bloating in the abdomen. Passage of more gas than usual.  Walking can help get rid of the air that was put into your GI tract during the procedure and reduce the bloating. If you had a lower endoscopy (such as a colonoscopy or flexible sigmoidoscopy) you may notice spotting of blood in your stool or on the toilet paper. If you underwent a bowel prep for your procedure, then you may not have a normal bowel movement for a few days.  DIET: Your first meal following the procedure should be a light meal and then it is ok to progress to your normal diet.  A half-sandwich or bowl of soup is an example of a good first meal.  Heavy or fried foods are harder to digest and may make you feel nauseous or bloated.  Likewise meals heavy in dairy and vegetables can cause extra gas to form and this can also increase the bloating.  Drink plenty of fluids but you should avoid alcoholic beverages for 24 hours.  ACTIVITY: Your care partner should take you home directly after the procedure.  You should plan to take it easy, moving slowly for the rest of the day.  You can resume normal activity the day after the procedure however you should NOT DRIVE or use heavy machinery for 24 hours (because of the sedation medicines used during the test).    SYMPTOMS TO REPORT  IMMEDIATELY: A gastroenterologist can be reached at any hour.  During normal business hours, 8:30 AM to 5:00 PM Monday through Friday, call [phone].  After hours and on weekends, please call the GI answering service at [phone] who will take a message and have the physician on call contact you.   Following lower endoscopy (colonoscopy or flexible sigmoidoscopy):  Excessive amounts of blood in the stool  Significant tenderness or worsening of abdominal pains  Swelling of the abdomen that is new, acute  Fever of 100F or higher  FOLLOW UP: If any biopsies were taken you will be contacted by phone or by letter within the next 1-3 weeks.  Call your gastroenterologist if you have not heard about the biopsies in 3 weeks.  Our staff will call the home number listed on your records the next business day following your procedure to check on you and address any questions or concerns that you may have at that time regarding the information given to you following your procedure. This is a courtesy call and so if there is no answer at the home number and we have not heard from you through the emergency physician on call, we will assume that you have returned to your regular daily activities without incident.  SIGNATURES/CONFIDENTIALITY: You and/or your care partner have signed paperwork which will be entered into your electronic medical record.  These signatures attest to the fact that that the information above on your After Visit Summary   has been reviewed and is understood.  Full responsibility of the confidentiality of this discharge information lies with you and/or your care-partner. 

## 2014-01-18 NOTE — Op Note (Signed)
[facility]  [address] [place], [address]   COLONOSCOPY PROCEDURE REPORT  PATIENT: [name], Kelly  MR#: [id number] BIRTHDATE: 03-Jun-1956 , 61  yrs. old GENDER: Female ENDOSCOPIST: Ladene Artist, MD, FACG REFERRED OA:CZYSAY [name], [place] PROCEDURE DATE:  01/18/2014 PROCEDURE:   Colonoscopy, diagnostic and hemorrhoidectomy via sclerosing First Screening Colonoscopy - Avg.  risk and is 50 yrs.  old or older - No.  Prior Negative Screening - Now for repeat screening. N/A  History of Adenoma - Now for follow-up colonoscopy & has been > or = to 3 yrs.  N/A  Polyps Removed Today? No.  Recommend repeat exam, <10 yrs? No. ASA CLASS:   Class II INDICATIONS:hematochezia. MEDICATIONS: MAC sedation, administered by CRNA and propofol (Diprivan) 330mg  IV DESCRIPTION OF PROCEDURE:   After the risks benefits and alternatives of the procedure were thoroughly explained, informed consent was obtained.  A digital rectal exam revealed external hemorrhoids.   The LB [id number]  endoscope was introduced through the anus and advanced to the cecum, which was identified by both the appendix and ileocecal valve. No adverse events experienced.   The quality of the prep was excellent, using MoviPrep  The instrument was then slowly withdrawn as the colon was fully examined.  COLON FINDINGS: Mild diverticulosis was noted in the sigmoid colon. The colon was otherwise normal.  There was no diverticulosis, inflammation, polyps or cancers unless previously stated. Retroflexed views revealed moderate internal hemorrhoids. 1.5 cc of 23.4% saline injected into the internal hemorrhoids well above the dentate line. The time to cecum=5 minutes 55 seconds.  Withdrawal time=10 minutes 03 seconds.  The scope was withdrawn and the procedure completed. COMPLICATIONS: There were no complications.  ENDOSCOPIC IMPRESSION: 1.   Mild diverticulosis in the sigmoid colon 2.   Moderate internal  hemorrhoids and external hemorrhoids  RECOMMENDATIONS: 1.  Hold aspirin, aspirin products, and anti-inflammatory medication for 2 weeks. 2.  High fiber diet with liberal fluid intake. 3.  You should continue to follow colorectal cancer screening guidelines for "routine risk" patients with a repeat colonoscopy in 10 years.  There is no need for routine, screening FOBT (stool) testing for at least 5 years.  eSigned:  Ladene Artist, MD, [facility] 01/18/2014 11:37 AM

## 2014-01-19 ENCOUNTER — Telehealth: Payer: Self-pay | Admitting: *Deleted

## 2014-01-19 NOTE — Telephone Encounter (Signed)
  Follow up Call-  Call back number 01/18/2014  Post procedure Call Back phone  # [phone]  Permission to leave phone message Yes     Patient questions:  Do you have a fever, pain , or abdominal swelling? no Pain Score  0 *  Have you tolerated food without any problems? yes  Have you been able to return to your normal activities? yes  Do you have any questions about your discharge instructions: Diet   no Medications  no Follow up visit  no  Do you have questions or concerns about your Care? no  Actions: * If pain score is 4 or above: No action needed, pain <4.

## 2014-03-03 ENCOUNTER — Other Ambulatory Visit: Payer: Self-pay | Admitting: Physician Assistant

## 2014-03-08 ENCOUNTER — Encounter: Payer: Self-pay | Admitting: Physician Assistant

## 2014-03-08 ENCOUNTER — Ambulatory Visit: Payer: BC Managed Care – PPO | Admitting: Physician Assistant

## 2014-03-08 ENCOUNTER — Ambulatory Visit (INDEPENDENT_AMBULATORY_CARE_PROVIDER_SITE_OTHER): Payer: BC Managed Care – PPO | Admitting: Physician Assistant

## 2014-03-08 VITALS — BP 115/75 | HR 74 | Temp 98.0°F | Resp 16 | Ht 65.0 in | Wt 158.6 lb

## 2014-03-08 DIAGNOSIS — I10 Essential (primary) hypertension: Secondary | ICD-10-CM

## 2014-03-08 DIAGNOSIS — E7800 Pure hypercholesterolemia, unspecified: Secondary | ICD-10-CM

## 2014-03-08 DIAGNOSIS — E039 Hypothyroidism, unspecified: Secondary | ICD-10-CM

## 2014-03-08 LAB — COMPREHENSIVE METABOLIC PANEL
ALBUMIN: 4.5 g/dL (ref 3.5–5.2)
ALT: 12 U/L (ref 0–35)
AST: 14 U/L (ref 0–37)
Alkaline Phosphatase: 47 U/L (ref 39–117)
BUN: 18 mg/dL (ref 6–23)
CALCIUM: 9.5 mg/dL (ref 8.4–10.5)
CHLORIDE: 101 meq/L (ref 96–112)
CO2: 27 meq/L (ref 19–32)
CREATININE: 0.96 mg/dL (ref 0.50–1.10)
Glucose, Bld: 95 mg/dL (ref 70–99)
POTASSIUM: 4.5 meq/L (ref 3.5–5.3)
Sodium: 135 mEq/L (ref 135–145)
Total Bilirubin: 0.5 mg/dL (ref 0.2–1.2)
Total Protein: 6.8 g/dL (ref 6.0–8.3)

## 2014-03-08 LAB — CBC
HEMATOCRIT: 39.7 % (ref 36.0–46.0)
Hemoglobin: 13.6 g/dL (ref 12.0–15.0)
MCH: 30.9 pg (ref 26.0–34.0)
MCHC: 34.3 g/dL (ref 30.0–36.0)
MCV: 90.2 fL (ref 78.0–100.0)
Platelets: 211 10*3/uL (ref 150–400)
RBC: 4.4 MIL/uL (ref 3.87–5.11)
RDW: 13.3 % (ref 11.5–15.5)
WBC: 16.5 10*3/uL — AB (ref 4.0–10.5)

## 2014-03-08 LAB — LIPID PANEL
CHOLESTEROL: 149 mg/dL (ref 0–200)
HDL: 42 mg/dL (ref >39)
LDL Cholesterol: 84 mg/dL (ref 0–99)
Total CHOL/HDL Ratio: 3.5 Ratio
Triglycerides: 116 mg/dL (ref <150)
VLDL: 23 mg/dL (ref 0–40)

## 2014-03-08 LAB — TSH: TSH: 2.092 u[IU]/mL (ref 0.350–4.500)

## 2014-03-08 NOTE — Patient Instructions (Signed)
I will contact you with your lab results as soon as they are available.   If you have not heard from me in 2 weeks, please contact me.  The fastest way to get your results is to register for My Chart (see the instructions on the last page of this printout).   

## 2014-03-09 NOTE — Progress Notes (Signed)
   Subjective:    Patient ID: Kelly [name], female    DOB: 11/26/1955, 58 y.o.   MRN: [id number]   PCP: JEFFERY,CHELLE, PA-C  Chief Complaint  Patient presents with  . Follow-up    6  mth chk uo - Pt is fasting    Medications, allergies, past medical history, surgical history, family history, social history and problem list reviewed and updated.  HPI  Patient presents for follow-up of her chronic medical problems: HTN, elevated lipids and hypothyroidism. She feels well, and denies adverse effects of the medications.   Review of Systems  Constitutional: Negative.   HENT: Negative.   Eyes: Negative.   Respiratory: Negative.   Cardiovascular: Negative.   Gastrointestinal: Negative.   Endocrine: Negative.   Genitourinary: Negative.   Musculoskeletal: Negative.   Skin: Negative.   Neurological: Negative.   Psychiatric/Behavioral: Negative.        Objective:   Physical Exam  Vitals reviewed. Constitutional: She is oriented to person, place, and time. Vital signs are normal. She appears well-developed and well-nourished. She is active and cooperative. No distress.  BP 115/75  Pulse 74  Temp(Src) 98 F (36.7 C) (Oral)  Resp 16  Ht 5\' 5"  (1.651 m)  Wt 158 lb 9.6 oz (71.94 kg)  BMI 26.39 kg/m2  SpO2 96%  HENT:  Head: Normocephalic and atraumatic.  Right Ear: Hearing normal.  Left Ear: Hearing normal.  Eyes: Conjunctivae are normal. No scleral icterus.  Neck: Normal range of motion. Neck supple. No thyromegaly present.  Cardiovascular: Normal rate, regular rhythm and normal heart sounds.   Pulses:      Radial pulses are 2+ on the right side, and 2+ on the left side.  Pulmonary/Chest: Effort normal and breath sounds normal.  Lymphadenopathy:       Head (right side): No tonsillar, no preauricular, no posterior auricular and no occipital adenopathy present.       Head (left side): No tonsillar, no preauricular, no posterior auricular and no occipital adenopathy  present.    She has no cervical adenopathy.       Right: No supraclavicular adenopathy present.       Left: No supraclavicular adenopathy present.  Neurological: She is alert and oriented to person, place, and time. No sensory deficit.  Skin: Skin is warm, dry and intact. No rash noted. No cyanosis or erythema. Nails show no clubbing.  Psychiatric: She has a normal mood and affect.          Assessment & Plan:  1. Essential hypertension, benign Controlled. Continue current treatment. - Comprehensive metabolic panel - CBC  2. Pure hypercholesterolemia Await lab results.  - Lipid panel  3. Hypothyroidism Await lab results. - TSH  Return in about 6 months (around 09/07/2014) for complete physical and fasting labs.  Fara Chute, PA-C Physician Assistant-Certified [facility]

## 2014-04-03 ENCOUNTER — Other Ambulatory Visit: Payer: Self-pay | Admitting: Physician Assistant

## 2014-05-03 ENCOUNTER — Other Ambulatory Visit: Payer: Self-pay

## 2014-05-03 ENCOUNTER — Encounter: Payer: Self-pay | Admitting: Physician Assistant

## 2014-05-03 DIAGNOSIS — E7800 Pure hypercholesterolemia, unspecified: Secondary | ICD-10-CM

## 2014-05-04 MED ORDER — LEVOTHYROXINE SODIUM 50 MCG PO TABS: ORAL_TABLET | ORAL | Status: DC

## 2014-05-04 MED ORDER — ROSUVASTATIN CALCIUM 10 MG PO TABS: 5.0000 mg | ORAL_TABLET | Freq: Every day | ORAL | Status: DC

## 2014-05-04 MED ORDER — LISINOPRIL 5 MG PO TABS: 5.0000 mg | ORAL_TABLET | Freq: Every day | ORAL | Status: DC

## 2014-05-04 NOTE — Telephone Encounter (Signed)
Sent in 6 months supply for pt per OV.  Pt has appt scheduled for December.

## 2014-07-07 ENCOUNTER — Other Ambulatory Visit: Payer: Self-pay

## 2014-07-07 DIAGNOSIS — Z1239 Encounter for other screening for malignant neoplasm of breast: Secondary | ICD-10-CM

## 2014-08-08 ENCOUNTER — Ambulatory Visit: Payer: BC Managed Care – PPO

## 2014-09-13 ENCOUNTER — Encounter: Payer: BC Managed Care – PPO | Admitting: Physician Assistant

## 2014-09-22 ENCOUNTER — Ambulatory Visit (INDEPENDENT_AMBULATORY_CARE_PROVIDER_SITE_OTHER): Payer: BC Managed Care – PPO | Admitting: Physician Assistant

## 2014-09-22 VITALS — BP 110/70 | HR 72 | Temp 98.0°F | Resp 16 | Ht 65.25 in | Wt 162.0 lb

## 2014-09-22 DIAGNOSIS — E039 Hypothyroidism, unspecified: Secondary | ICD-10-CM

## 2014-09-22 DIAGNOSIS — C9110 Chronic lymphocytic leukemia of B-cell type not having achieved remission: Secondary | ICD-10-CM

## 2014-09-22 DIAGNOSIS — E7800 Pure hypercholesterolemia, unspecified: Secondary | ICD-10-CM

## 2014-09-22 DIAGNOSIS — I10 Essential (primary) hypertension: Secondary | ICD-10-CM

## 2014-09-22 DIAGNOSIS — R0982 Postnasal drip: Secondary | ICD-10-CM

## 2014-09-22 DIAGNOSIS — F419 Anxiety disorder, unspecified: Secondary | ICD-10-CM

## 2014-09-22 DIAGNOSIS — Z0000 Encounter for general adult medical examination without abnormal findings: Secondary | ICD-10-CM

## 2014-09-22 LAB — CBC WITH DIFFERENTIAL/PLATELET
BASOS ABS: 0 10*3/uL (ref 0.0–0.1)
BASOS PCT: 0 % (ref 0–1)
EOS ABS: 0.2 10*3/uL (ref 0.0–0.7)
Eosinophils Relative: 1 % (ref 0–5)
HEMATOCRIT: 40.8 % (ref 36.0–46.0)
Hemoglobin: 13.7 g/dL (ref 12.0–15.0)
Lymphocytes Relative: 78 % — ABNORMAL HIGH (ref 12–46)
Lymphs Abs: 17.2 10*3/uL — ABNORMAL HIGH (ref 0.7–4.0)
MCH: 30.9 pg (ref 26.0–34.0)
MCHC: 33.6 g/dL (ref 30.0–36.0)
MCV: 91.9 fL (ref 78.0–100.0)
MONOS PCT: 3 % (ref 3–12)
MPV: 11.2 fL (ref 8.6–12.4)
Monocytes Absolute: 0.7 10*3/uL (ref 0.1–1.0)
NEUTROS ABS: 4 10*3/uL (ref 1.7–7.7)
NEUTROS PCT: 18 % — AB (ref 43–77)
PLATELETS: 235 10*3/uL (ref 150–400)
RBC: 4.44 MIL/uL (ref 3.87–5.11)
RDW: 13.2 % (ref 11.5–15.5)
WBC: 22 10*3/uL — ABNORMAL HIGH (ref 4.0–10.5)

## 2014-09-22 LAB — POCT URINALYSIS DIPSTICK
BILIRUBIN UA: NEGATIVE
Glucose, UA: NEGATIVE
Ketones, UA: NEGATIVE
Nitrite, UA: NEGATIVE
PH UA: 5.5
Protein, UA: NEGATIVE
RBC UA: NEGATIVE
Spec Grav, UA: <=1.005
Urobilinogen, UA: 0.2

## 2014-09-22 LAB — LIPID PANEL
CHOLESTEROL: 146 mg/dL (ref 0–200)
HDL: 42 mg/dL (ref >39)
LDL CALC: 75 mg/dL (ref 0–99)
Total CHOL/HDL Ratio: 3.5 Ratio
Triglycerides: 146 mg/dL (ref <150)
VLDL: 29 mg/dL (ref 0–40)

## 2014-09-22 LAB — COMPREHENSIVE METABOLIC PANEL
ALBUMIN: 4.6 g/dL (ref 3.5–5.2)
ALT: 10 U/L (ref 0–35)
AST: 14 U/L (ref 0–37)
Alkaline Phosphatase: 51 U/L (ref 39–117)
BUN: 11 mg/dL (ref 6–23)
CO2: 27 meq/L (ref 19–32)
Calcium: 9.6 mg/dL (ref 8.4–10.5)
Chloride: 101 mEq/L (ref 96–112)
Creat: 0.91 mg/dL (ref 0.50–1.10)
Glucose, Bld: 98 mg/dL (ref 70–99)
POTASSIUM: 4.2 meq/L (ref 3.5–5.3)
Sodium: 138 mEq/L (ref 135–145)
Total Bilirubin: 0.5 mg/dL (ref 0.2–1.2)
Total Protein: 7.1 g/dL (ref 6.0–8.3)

## 2014-09-22 LAB — TSH: TSH: 1.857 u[IU]/mL (ref 0.350–4.500)

## 2014-09-22 MED ORDER — LISINOPRIL 5 MG PO TABS: 5.0000 mg | ORAL_TABLET | Freq: Every day | ORAL | Status: AC

## 2014-09-22 MED ORDER — LEVOTHYROXINE SODIUM 50 MCG PO TABS: ORAL_TABLET | ORAL | Status: AC

## 2014-09-22 MED ORDER — ALPRAZOLAM 0.25 MG PO TABS: 0.2500 mg | ORAL_TABLET | Freq: Every evening | ORAL | Status: DC | PRN

## 2014-09-22 MED ORDER — IPRATROPIUM BROMIDE 0.03 % NA SOLN: 2.0000 | Freq: Two times a day (BID) | NASAL | Status: DC

## 2014-09-22 MED ORDER — ROSUVASTATIN CALCIUM 10 MG PO TABS: 5.0000 mg | ORAL_TABLET | Freq: Every day | ORAL | Status: AC

## 2014-09-22 NOTE — Progress Notes (Signed)
Subjective:    Patient ID: Kelly [name], female    DOB: 1956/09/21, 58 y.o.   MRN: [id number]  HPI  This is a 58 year old female presenting for annual physical. Her PCP is [facility].  1 year ago she was dx'd with CLL. She is followed annually by oncologist Dr. Alvy Bimler. She reports she is doing well. She needs refills of her medications: lisinopril, crestor, synthroid, xanax. She takes xanax very prn - last took several months ago. She needed it more when she was first dx'd with CLL. She likes to keep it on hand just in case.  She is having a scratchy throat and post-nasal drip x 1 week. It is getting somewhat better. She was around her grandchildren over christmas who had colds.  Immunizations: flu shot this year. Last tdap 2012. Dentist: goes [facility]. Eye: last saw eye doctor 6 months ago. Noticed change in her vision but did not make change to her prescription at that time. Diet/Exercise: Eats a balanced diet. Walks 30 minutes a couples times a week.  Mammogram: 2 months ago and normal.  Last pap: 1 year ago and normal. Never had an abnormal. Colonoscopy: last 6 months ago and normal. Pushed it up a couple years d/t CLL.  Review of Systems  Constitutional: Negative.  Negative for fever and chills.  HENT: Positive for postnasal drip and sore throat. Negative for congestion and ear pain.   Eyes: Negative.   Respiratory: Negative.   Cardiovascular: Negative.   Gastrointestinal: Negative.   Endocrine: Negative.   Genitourinary: Negative.   Musculoskeletal: Negative.   Skin: Negative.   Neurological: Negative.   Psychiatric/Behavioral: Negative.     Patient Active Problem List   Diagnosis Date Noted  . CLL (chronic lymphocytic leukemia) 09/15/2013  . Leukocytosis, unspecified 09/14/2013  . Anxiety 11/25/2012  . Hypothyroidism 11/25/2012  . Allergic rhinitis 11/25/2012  . Essential hypertension, benign 04/24/2012  . Palpitations 04/24/2012  . Pure  hypercholesterolemia 04/24/2012   Prior to Admission medications   Medication Sig Start Date End Date Taking? Authorizing Provider  ALPRAZolam (XANAX) 0.25 MG tablet Take 1 tablet (0.25 mg total) by mouth at bedtime as needed. 06/16/12  Yes Barton Fanny, MD  aspirin 81 MG tablet Take 81 mg by mouth daily.   Yes Historical Provider, MD  cholecalciferol (VITAMIN D) 1000 UNITS tablet Take 1,000 Units by mouth daily.   Yes Historical Provider, MD  estradiol (VIVELLE-DOT) 0.1 MG/24HR Place 0.5 patches onto the skin 2 (two) times a week.    Yes Historical Provider, MD  levothyroxine (SYNTHROID, LEVOTHROID) 50 MCG tablet TAKE 1 TABLET BY MOUTH EVERY DAY. 05/04/14  Yes Heather M Marte, PA-C  lisinopril (PRINIVIL,ZESTRIL) 5 MG tablet Take 1 tablet (5 mg total) by mouth daily. PATIENT NEEDS OFFICE VISIT FOR ADDITIONAL REFILLS 05/04/14  Yes Heather M Marte, PA-C  progesterone (PROMETRIUM) 100 MG capsule Take 100 mg by mouth daily.   Yes Historical Provider, MD  rosuvastatin (CRESTOR) 10 MG tablet Take 0.5 tablets (5 mg total) by mouth daily. 1/2 tab qd 05/04/14  Yes Heather Elnora Morrison, PA-C   No Known Allergies  Patient's social and family history were reviewed.     Objective:   Physical Exam  Constitutional: She is oriented to person, place, and time. She appears well-developed and well-nourished. No distress.  HENT:  Head: Normocephalic and atraumatic.  Right Ear: Hearing, tympanic membrane, external ear and ear canal normal.  Left Ear: Hearing, tympanic membrane, external ear and  ear canal normal.  Nose: Nose normal. Right sinus exhibits no maxillary sinus tenderness and no frontal sinus tenderness. Left sinus exhibits no maxillary sinus tenderness and no frontal sinus tenderness.  Mouth/Throat: Uvula is midline, oropharynx is clear and moist and mucous membranes are normal.  Eyes: Conjunctivae, EOM and lids are normal. Pupils are equal, round, and reactive to light. Right eye exhibits no  discharge. Left eye exhibits no discharge. No scleral icterus.  Neck: Trachea normal. Carotid bruit is not present.  Cardiovascular: Normal rate, regular rhythm, normal heart sounds, intact distal pulses and normal pulses.   No murmur heard. Pulmonary/Chest: Effort normal and breath sounds normal. No respiratory distress. She has no wheezes. She has no rhonchi. She has no rales.  Abdominal: Soft. Normal appearance and bowel sounds are normal. There is no tenderness.  Musculoskeletal: Normal range of motion.  Lymphadenopathy:       Head (right side): No submental, no submandibular, no tonsillar, no preauricular, no posterior auricular and no occipital adenopathy present.       Head (left side): No submental, no submandibular, no tonsillar, no preauricular, no posterior auricular and no occipital adenopathy present.    She has no cervical adenopathy.  Neurological: She is alert and oriented to person, place, and time. She has normal strength and normal reflexes. No sensory deficit.  Skin: Skin is warm, dry and intact. No lesion and no rash noted.  Psychiatric: She has a normal mood and affect. Her speech is normal and behavior is normal. Thought content normal.   BP 110/70 mmHg  Pulse 72  Temp(Src) 98 F (36.7 C) (Oral)  Resp 16  Ht 5' 5.25" (1.657 m)  Wt 162 lb (73.483 kg)  BMI 26.76 kg/m2  SpO2 98%  Results for orders placed or performed in visit on 09/22/14  POCT urinalysis dipstick  Result Value Ref Range   Color, UA yellow    Clarity, UA clear    Glucose, UA neg    Bilirubin, UA neg    Ketones, UA neg    Spec Grav, UA <=1.005    Blood, UA neg    pH, UA 5.5    Protein, UA neg    Urobilinogen, UA 0.2    Nitrite, UA neg    Leukocytes, UA small (1+)        Assessment & Plan:  1. Essential hypertension, benign Stable. - Comprehensive metabolic panel - lisinopril (PRINIVIL,ZESTRIL) 5 MG tablet; Take 1 tablet (5 mg total) by mouth daily. PATIENT NEEDS OFFICE VISIT FOR  ADDITIONAL REFILLS  Dispense: 90 tablet; Refill: 1  2. Hypothyroidism, unspecified hypothyroidism type Stable. - TSH - levothyroxine (SYNTHROID, LEVOTHROID) 50 MCG tablet; TAKE 1 TABLET BY MOUTH EVERY DAY.  Dispense: 90 tablet; Refill: 1  3. Anxiety - ALPRAZolam (XANAX) 0.25 MG tablet; Take 1 tablet (0.25 mg total) by mouth at bedtime as needed.  Dispense: 30 tablet; Refill: 1  4. CLL (chronic lymphocytic leukemia) Follow up with oncologist.  5. Pure hypercholesterolemia Last lipids 6 months ago normal. - Lipid panel - rosuvastatin (CRESTOR) 10 MG tablet; Take 0.5 tablets (5 mg total) by mouth daily. 1/2 tab qd  Dispense: 90 tablet; Refill: 1  6. Annual physical exam - POCT urinalysis dipstick - CBC with Differential  7. Postnasal drip - ipratropium (ATROVENT) 0.03 % nasal spray; Place 2 sprays into both nostrils 2 (two) times daily.  Dispense: 30 mL; Refill: 0   Kelly [name] V. Drenda Freeze, MHS [facility]  09/22/2014

## 2014-09-22 NOTE — Progress Notes (Signed)
The patient was discussed with me and I agree with the diagnosis and treatment plan.  

## 2014-09-22 NOTE — Patient Instructions (Signed)
Use nasal spray twice a day until symptoms resolve. I will send you a mychart message with your results. Return in 6 months for follow up with Chelle.  Preventive Care for Adults A healthy lifestyle and preventive care can promote health and wellness. Preventive health guidelines for women include the following key practices.  A routine yearly physical is a good way to check with your health care provider about your health and preventive screening. It is a chance to share any concerns and updates on your health and to receive a thorough exam.  Visit your dentist for a routine exam and preventive care every 6 months. Brush your teeth twice a day and floss once a day. Good oral hygiene prevents tooth decay and gum disease.  The frequency of eye exams is based on your age, health, family medical history, use of contact lenses, and other factors. Follow your health care provider's recommendations for frequency of eye exams.  Eat a healthy diet. Foods like vegetables, fruits, whole grains, low-fat dairy products, and lean protein foods contain the nutrients you need without too many calories. Decrease your intake of foods high in solid fats, added sugars, and salt. Eat the right amount of calories for you.Get information about a proper diet from your health care provider, if necessary.  Regular physical exercise is one of the most important things you can do for your health. Most adults should get at least 150 minutes of moderate-intensity exercise (any activity that increases your heart rate and causes you to sweat) each week. In addition, most adults need muscle-strengthening exercises on 2 or more days a week.  Maintain a healthy weight. The body mass index (BMI) is a screening tool to identify possible weight problems. It provides an estimate of body fat based on height and weight. Your health care provider can find your BMI and can help you achieve or maintain a healthy weight.For adults 20 years  and older:  A BMI below 18.5 is considered underweight.  A BMI of 18.5 to 24.9 is normal.  A BMI of 25 to 29.9 is considered overweight.  A BMI of 30 and above is considered obese.  Maintain normal blood lipids and cholesterol levels by exercising and minimizing your intake of saturated fat. Eat a balanced diet with plenty of fruit and vegetables. Blood tests for lipids and cholesterol should begin at age 38 and be repeated every 5 years. If your lipid or cholesterol levels are high, you are over 50, or you are at high risk for heart disease, you may need your cholesterol levels checked more frequently.Ongoing high lipid and cholesterol levels should be treated with medicines if diet and exercise are not working.  If you smoke, find out from your health care provider how to quit. If you do not use tobacco, do not start.  Lung cancer screening is recommended for adults aged 51-80 years who are at high risk for developing lung cancer because of a history of smoking. A yearly low-dose CT scan of the lungs is recommended for people who have at least a 30-pack-year history of smoking and are a current smoker or have quit within the past 15 years. A pack year of smoking is smoking an average of 1 pack of cigarettes a day for 1 year (for example: 1 pack a day for 30 years or 2 packs a day for 15 years). Yearly screening should continue until the smoker has stopped smoking for at least 15 years. Yearly screening should be stopped  for people who develop a health problem that would prevent them from having lung cancer treatment.  If you are pregnant, do not drink alcohol. If you are breastfeeding, be very cautious about drinking alcohol. If you are not pregnant and choose to drink alcohol, do not have more than 1 drink per day. One drink is considered to be 12 ounces (355 mL) of beer, 5 ounces (148 mL) of wine, or 1.5 ounces (44 mL) of liquor.  Avoid use of street drugs. Do not share needles with anyone.  Ask for help if you need support or instructions about stopping the use of drugs.  High blood pressure causes heart disease and increases the risk of stroke. Your blood pressure should be checked at least every 1 to 2 years. Ongoing high blood pressure should be treated with medicines if weight loss and exercise do not work.  If you are 33-64 years old, ask your health care provider if you should take aspirin to prevent strokes.  Diabetes screening involves taking a blood sample to check your fasting blood sugar level. This should be done once every 3 years, after age 29, if you are within normal weight and without risk factors for diabetes. Testing should be considered at a younger age or be carried out more frequently if you are overweight and have at least 1 risk factor for diabetes.  Breast cancer screening is essential preventive care for women. You should practice "breast self-awareness." This means understanding the normal appearance and feel of your breasts and may include breast self-examination. Any changes detected, no matter how small, should be reported to a health care provider. Women in their 57s and 30s should have a clinical breast exam (CBE) by a health care provider as part of a regular health exam every 1 to 3 years. After age 24, women should have a CBE every year. Starting at age 81, women should consider having a mammogram (breast X-ray test) every year. Women who have a family history of breast cancer should talk to their health care provider about genetic screening. Women at a high risk of breast cancer should talk to their health care providers about having an MRI and a mammogram every year.  Breast cancer gene (BRCA)-related cancer risk assessment is recommended for women who have family members with BRCA-related cancers. BRCA-related cancers include breast, ovarian, tubal, and peritoneal cancers. Having family members with these cancers may be associated with an increased risk  for harmful changes (mutations) in the breast cancer genes BRCA1 and BRCA2. Results of the assessment will determine the need for genetic counseling and BRCA1 and BRCA2 testing.  Routine pelvic exams to screen for cancer are no longer recommended for nonpregnant women who are considered low risk for cancer of the pelvic organs (ovaries, uterus, and vagina) and who do not have symptoms. Ask your health care provider if a screening pelvic exam is right for you.  If you have had past treatment for cervical cancer or a condition that could lead to cancer, you need Pap tests and screening for cancer for at least 20 years after your treatment. If Pap tests have been discontinued, your risk factors (such as having a new sexual partner) need to be reassessed to determine if screening should be resumed. Some women have medical problems that increase the chance of getting cervical cancer. In these cases, your health care provider may recommend more frequent screening and Pap tests.  The HPV test is an additional test that may be used  for cervical cancer screening. The HPV test looks for the virus that can cause the cell changes on the cervix. The cells collected during the Pap test can be tested for HPV. The HPV test could be used to screen women aged 44 years and older, and should be used in women of any age who have unclear Pap test results. After the age of 51, women should have HPV testing at the same frequency as a Pap test.  Colorectal cancer can be detected and often prevented. Most routine colorectal cancer screening begins at the age of 63 years and continues through age 22 years. However, your health care provider may recommend screening at an earlier age if you have risk factors for colon cancer. On a yearly basis, your health care provider may provide home test kits to check for hidden blood in the stool. Use of a small camera at the end of a tube, to directly examine the colon (sigmoidoscopy or  colonoscopy), can detect the earliest forms of colorectal cancer. Talk to your health care provider about this at age 75, when routine screening begins. Direct exam of the colon should be repeated every 5-10 years through age 33 years, unless early forms of pre-cancerous polyps or small growths are found.  People who are at an increased risk for hepatitis B should be screened for this virus. You are considered at high risk for hepatitis B if:  You were born in a country where hepatitis B occurs often. Talk with your health care provider about which countries are considered high risk.  Your parents were born in a high-risk country and you have not received a shot to protect against hepatitis B (hepatitis B vaccine).  You have HIV or AIDS.  You use needles to inject street drugs.  You live with, or have sex with, someone who has hepatitis B.  You get hemodialysis treatment.  You take certain medicines for conditions like cancer, organ transplantation, and autoimmune conditions.  Hepatitis C blood testing is recommended for all people born from [birth year] through 1965 and any individual with known risks for hepatitis C.  Practice safe sex. Use condoms and avoid high-risk sexual practices to reduce the spread of sexually transmitted infections (STIs). STIs include gonorrhea, chlamydia, syphilis, trichomonas, herpes, HPV, and human immunodeficiency virus (HIV). Herpes, HIV, and HPV are viral illnesses that have no cure. They can result in disability, cancer, and death.  You should be screened for sexually transmitted illnesses (STIs) including gonorrhea and chlamydia if:  You are sexually active and are younger than 24 years.  You are older than 24 years and your health care provider tells you that you are at risk for this type of infection.  Your sexual activity has changed since you were last screened and you are at an increased risk for chlamydia or gonorrhea. Ask your health care provider if  you are at risk.  If you are at risk of being infected with HIV, it is recommended that you take a prescription medicine daily to prevent HIV infection. This is called preexposure prophylaxis (PrEP). You are considered at risk if:  You are a heterosexual woman, are sexually active, and are at increased risk for HIV infection.  You take drugs by injection.  You are sexually active with a partner who has HIV.  Talk with your health care provider about whether you are at high risk of being infected with HIV. If you choose to begin PrEP, you should first be tested for HIV.  You should then be tested every 3 months for as long as you are taking PrEP.  Osteoporosis is a disease in which the bones lose minerals and strength with aging. This can result in serious bone fractures or breaks. The risk of osteoporosis can be identified using a bone density scan. Women ages 38 years and over and women at risk for fractures or osteoporosis should discuss screening with their health care providers. Ask your health care provider whether you should take a calcium supplement or vitamin D to reduce the rate of osteoporosis.  Menopause can be associated with physical symptoms and risks. Hormone replacement therapy is available to decrease symptoms and risks. You should talk to your health care provider about whether hormone replacement therapy is right for you.  Use sunscreen. Apply sunscreen liberally and repeatedly throughout the day. You should seek shade when your shadow is shorter than you. Protect yourself by wearing long sleeves, pants, a wide-brimmed hat, and sunglasses year round, whenever you are outdoors.  Once a month, do a whole body skin exam, using a mirror to look at the skin on your back. Tell your health care provider of new moles, moles that have irregular borders, moles that are larger than a pencil eraser, or moles that have changed in shape or color.  Stay current with required vaccines  (immunizations).  Influenza vaccine. All adults should be immunized every year.  Tetanus, diphtheria, and acellular pertussis (Td, Tdap) vaccine. Pregnant women should receive 1 dose of Tdap vaccine during each pregnancy. The dose should be obtained regardless of the length of time since the last dose. Immunization is preferred during the 27th-36th week of gestation. An adult who has not previously received Tdap or who does not know her vaccine status should receive 1 dose of Tdap. This initial dose should be followed by tetanus and diphtheria toxoids (Td) booster doses every 10 years. Adults with an unknown or incomplete history of completing a 3-dose immunization series with Td-containing vaccines should begin or complete a primary immunization series including a Tdap dose. Adults should receive a Td booster every 10 years.  Varicella vaccine. An adult without evidence of immunity to varicella should receive 2 doses or a second dose if she has previously received 1 dose. Pregnant females who do not have evidence of immunity should receive the first dose after pregnancy. This first dose should be obtained before leaving the health care facility. The second dose should be obtained 4-8 weeks after the first dose.  Human papillomavirus (HPV) vaccine. Females aged 13-26 years who have not received the vaccine previously should obtain the 3-dose series. The vaccine is not recommended for use in pregnant females. However, pregnancy testing is not needed before receiving a dose. If a female is found to be pregnant after receiving a dose, no treatment is needed. In that case, the remaining doses should be delayed until after the pregnancy. Immunization is recommended for any person with an immunocompromised condition through the age of 60 years if she did not get any or all doses earlier. During the 3-dose series, the second dose should be obtained 4-8 weeks after the first dose. The third dose should be obtained  24 weeks after the first dose and 16 weeks after the second dose.  Zoster vaccine. One dose is recommended for adults aged 38 years or older unless certain conditions are present.  Measles, mumps, and rubella (MMR) vaccine. Adults born before 24 generally are considered immune to measles and mumps. Adults born  in 1957 or later should have 1 or more doses of MMR vaccine unless there is a contraindication to the vaccine or there is laboratory evidence of immunity to each of the three diseases. A routine second dose of MMR vaccine should be obtained at least 28 days after the first dose for students attending postsecondary schools, health care workers, or international travelers. People who received inactivated measles vaccine or an unknown type of measles vaccine during 1963-1967 should receive 2 doses of MMR vaccine. People who received inactivated mumps vaccine or an unknown type of mumps vaccine before 1979 and are at high risk for mumps infection should consider immunization with 2 doses of MMR vaccine. For females of childbearing age, rubella immunity should be determined. If there is no evidence of immunity, females who are not pregnant should be vaccinated. If there is no evidence of immunity, females who are pregnant should delay immunization until after pregnancy. Unvaccinated health care workers born before [birth year] who lack laboratory evidence of measles, mumps, or rubella immunity or laboratory confirmation of disease should consider measles and mumps immunization with 2 doses of MMR vaccine or rubella immunization with 1 dose of MMR vaccine.  Pneumococcal 13-valent conjugate (PCV13) vaccine. When indicated, a person who is uncertain of her immunization history and has no record of immunization should receive the PCV13 vaccine. An adult aged 4 years or older who has certain medical conditions and has not been previously immunized should receive 1 dose of PCV13 vaccine. This PCV13 should be followed  with a dose of pneumococcal polysaccharide (PPSV23) vaccine. The PPSV23 vaccine dose should be obtained at least 8 weeks after the dose of PCV13 vaccine. An adult aged 44 years or older who has certain medical conditions and previously received 1 or more doses of PPSV23 vaccine should receive 1 dose of PCV13. The PCV13 vaccine dose should be obtained 1 or more years after the last PPSV23 vaccine dose.  Pneumococcal polysaccharide (PPSV23) vaccine. When PCV13 is also indicated, PCV13 should be obtained first. All adults aged 47 years and older should be immunized. An adult younger than age 73 years who has certain medical conditions should be immunized. Any person who resides in a nursing home or long-term care facility should be immunized. An adult smoker should be immunized. People with an immunocompromised condition and certain other conditions should receive both PCV13 and PPSV23 vaccines. People with human immunodeficiency virus (HIV) infection should be immunized as soon as possible after diagnosis. Immunization during chemotherapy or radiation therapy should be avoided. Routine use of PPSV23 vaccine is not recommended for American Indians, [place]atives, or people younger than 65 years unless there are medical conditions that require PPSV23 vaccine. When indicated, people who have unknown immunization and have no record of immunization should receive PPSV23 vaccine. One-time revaccination 5 years after the first dose of PPSV23 is recommended for people aged 19-64 years who have chronic kidney failure, nephrotic syndrome, asplenia, or immunocompromised conditions. People who received 1-2 doses of PPSV23 before age 70 years should receive another dose of PPSV23 vaccine at age 4 years or later if at least 5 years have passed since the previous dose. Doses of PPSV23 are not needed for people immunized with PPSV23 at or after age 64 years.  Meningococcal vaccine. Adults with asplenia or persistent complement  component deficiencies should receive 2 doses of quadrivalent meningococcal conjugate (MenACWY-D) vaccine. The doses should be obtained at least 2 months apart. Microbiologists working with certain meningococcal bacteria, [employer] recruits, people at  risk during an outbreak, and people who travel to or live in countries with a high rate of meningitis should be immunized. A first-year college student up through age 12 years who is living in a residence hall should receive a dose if she did not receive a dose on or after her 16th birthday. Adults who have certain high-risk conditions should receive one or more doses of vaccine.  Hepatitis A vaccine. Adults who wish to be protected from this disease, have certain high-risk conditions, work with hepatitis A-infected animals, work in hepatitis A research labs, or travel to or work in countries with a high rate of hepatitis A should be immunized. Adults who were previously unvaccinated and who anticipate close contact with an international adoptee during the first 60 days after arrival in the [place] from a country with a high rate of hepatitis A should be immunized.  Hepatitis B vaccine. Adults who wish to be protected from this disease, have certain high-risk conditions, may be exposed to blood or other infectious body fluids, are household contacts or sex partners of hepatitis B positive people, are clients or workers in certain care facilities, or travel to or work in countries with a high rate of hepatitis B should be immunized.  Haemophilus influenzae type b (Hib) vaccine. A previously unvaccinated person with asplenia or sickle cell disease or having a scheduled splenectomy should receive 1 dose of Hib vaccine. Regardless of previous immunization, a recipient of a hematopoietic stem cell transplant should receive a 3-dose series 6-12 months after her successful transplant. Hib vaccine is not recommended for adults with HIV infection. Preventive  Services / Frequency Ages 35 to 79 years  Blood pressure check.** / Every 1 to 2 years.  Lipid and cholesterol check.** / Every 5 years beginning at age 45.  Clinical breast exam.** / Every 3 years for women in their 76s and 9s.  BRCA-related cancer risk assessment.** / For women who have family members with a BRCA-related cancer (breast, ovarian, tubal, or peritoneal cancers).  Pap test.** / Every 2 years from ages 108 through 4. Every 3 years starting at age 63 through age 73 or 7 with a history of 3 consecutive normal Pap tests.  HPV screening.** / Every 3 years from ages 33 through ages 17 to 65 with a history of 3 consecutive normal Pap tests.  Hepatitis C blood test.** / For any individual with known risks for hepatitis C.  Skin self-exam. / Monthly.  Influenza vaccine. / Every year.  Tetanus, diphtheria, and acellular pertussis (Tdap, Td) vaccine.** / Consult your health care provider. Pregnant women should receive 1 dose of Tdap vaccine during each pregnancy. 1 dose of Td every 10 years.  Varicella vaccine.** / Consult your health care provider. Pregnant females who do not have evidence of immunity should receive the first dose after pregnancy.  HPV vaccine. / 3 doses over 6 months, if 23 and younger. The vaccine is not recommended for use in pregnant females. However, pregnancy testing is not needed before receiving a dose.  Measles, mumps, rubella (MMR) vaccine.** / You need at least 1 dose of MMR if you were born in 1957 or later. You may also need a 2nd dose. For females of childbearing age, rubella immunity should be determined. If there is no evidence of immunity, females who are not pregnant should be vaccinated. If there is no evidence of immunity, females who are pregnant should delay immunization until after pregnancy.  Pneumococcal 13-valent conjugate (PCV13) vaccine.** /  Consult your health care provider.  Pneumococcal polysaccharide (PPSV23) vaccine.** / 1 to 2  doses if you smoke cigarettes or if you have certain conditions.  Meningococcal vaccine.** / 1 dose if you are age 42 to 56 years and a Market researcher living in a residence hall, or have one of several medical conditions, you need to get vaccinated against meningococcal disease. You may also need additional booster doses.  Hepatitis A vaccine.** / Consult your health care provider.  Hepatitis B vaccine.** / Consult your health care provider.  Haemophilus influenzae type b (Hib) vaccine.** / Consult your health care provider. Ages 84 to 82 years  Blood pressure check.** / Every 1 to 2 years.  Lipid and cholesterol check.** / Every 5 years beginning at age 39 years.  Lung cancer screening. / Every year if you are aged 30-80 years and have a 30-pack-year history of smoking and currently smoke or have quit within the past 15 years. Yearly screening is stopped once you have quit smoking for at least 15 years or develop a health problem that would prevent you from having lung cancer treatment.  Clinical breast exam.** / Every year after age 35 years.  BRCA-related cancer risk assessment.** / For women who have family members with a BRCA-related cancer (breast, ovarian, tubal, or peritoneal cancers).  Mammogram.** / Every year beginning at age 26 years and continuing for as long as you are in good health. Consult with your health care provider.  Pap test.** / Every 3 years starting at age 43 years through age 34 or 84 years with a history of 3 consecutive normal Pap tests.  HPV screening.** / Every 3 years from ages 61 years through ages 37 to 7 years with a history of 3 consecutive normal Pap tests.  Fecal occult blood test (FOBT) of stool. / Every year beginning at age 35 years and continuing until age 37 years. You may not need to do this test if you get a colonoscopy every 10 years.  Flexible sigmoidoscopy or colonoscopy.** / Every 5 years for a flexible sigmoidoscopy or every  10 years for a colonoscopy beginning at age 14 years and continuing until age 33 years.  Hepatitis C blood test.** / For all people born from [birth year] through 1965 and any individual with known risks for hepatitis C.  Skin self-exam. / Monthly.  Influenza vaccine. / Every year.  Tetanus, diphtheria, and acellular pertussis (Tdap/Td) vaccine.** / Consult your health care provider. Pregnant women should receive 1 dose of Tdap vaccine during each pregnancy. 1 dose of Td every 10 years.  Varicella vaccine.** / Consult your health care provider. Pregnant females who do not have evidence of immunity should receive the first dose after pregnancy.  Zoster vaccine.** / 1 dose for adults aged 76 years or older.  Measles, mumps, rubella (MMR) vaccine.** / You need at least 1 dose of MMR if you were born in 1957 or later. You may also need a 2nd dose. For females of childbearing age, rubella immunity should be determined. If there is no evidence of immunity, females who are not pregnant should be vaccinated. If there is no evidence of immunity, females who are pregnant should delay immunization until after pregnancy.  Pneumococcal 13-valent conjugate (PCV13) vaccine.** / Consult your health care provider.  Pneumococcal polysaccharide (PPSV23) vaccine.** / 1 to 2 doses if you smoke cigarettes or if you have certain conditions.  Meningococcal vaccine.** / Consult your health care provider.  Hepatitis A vaccine.** / Consult  your health care provider.  Hepatitis B vaccine.** / Consult your health care provider.  Haemophilus influenzae type b (Hib) vaccine.** / Consult your health care provider. Ages 71 years and over  Blood pressure check.** / Every 1 to 2 years.  Lipid and cholesterol check.** / Every 5 years beginning at age 31 years.  Lung cancer screening. / Every year if you are aged 26-80 years and have a 30-pack-year history of smoking and currently smoke or have quit within the past 15 years.  Yearly screening is stopped once you have quit smoking for at least 15 years or develop a health problem that would prevent you from having lung cancer treatment.  Clinical breast exam.** / Every year after age 47 years.  BRCA-related cancer risk assessment.** / For women who have family members with a BRCA-related cancer (breast, ovarian, tubal, or peritoneal cancers).  Mammogram.** / Every year beginning at age 57 years and continuing for as long as you are in good health. Consult with your health care provider.  Pap test.** / Every 3 years starting at age 78 years through age 75 or 40 years with 3 consecutive normal Pap tests. Testing can be stopped between 65 and 70 years with 3 consecutive normal Pap tests and no abnormal Pap or HPV tests in the past 10 years.  HPV screening.** / Every 3 years from ages 57 years through ages 50 or 78 years with a history of 3 consecutive normal Pap tests. Testing can be stopped between 65 and 70 years with 3 consecutive normal Pap tests and no abnormal Pap or HPV tests in the past 10 years.  Fecal occult blood test (FOBT) of stool. / Every year beginning at age 40 years and continuing until age 27 years. You may not need to do this test if you get a colonoscopy every 10 years.  Flexible sigmoidoscopy or colonoscopy.** / Every 5 years for a flexible sigmoidoscopy or every 10 years for a colonoscopy beginning at age 73 years and continuing until age 55 years.  Hepatitis C blood test.** / For all people born from [birth year] through 1965 and any individual with known risks for hepatitis C.  Osteoporosis screening.** / A one-time screening for women ages 65 years and over and women at risk for fractures or osteoporosis.  Skin self-exam. / Monthly.  Influenza vaccine. / Every year.  Tetanus, diphtheria, and acellular pertussis (Tdap/Td) vaccine.** / 1 dose of Td every 10 years.  Varicella vaccine.** / Consult your health care provider.  Zoster vaccine.** / 1  dose for adults aged 62 years or older.  Pneumococcal 13-valent conjugate (PCV13) vaccine.** / Consult your health care provider.  Pneumococcal polysaccharide (PPSV23) vaccine.** / 1 dose for all adults aged 90 years and older.  Meningococcal vaccine.** / Consult your health care provider.  Hepatitis A vaccine.** / Consult your health care provider.  Hepatitis B vaccine.** / Consult your health care provider.  Haemophilus influenzae type b (Hib) vaccine.** / Consult your health care provider. ** Family history and personal history of risk and conditions may change your health care provider's recommendations. Document Released: 11/05/2001 Document Revised: 01/24/2014 Document Reviewed: 02/04/2011 [facility] Patient Information 2015 [place], [place]. This information is not intended to replace advice given to you by your health care provider. Make sure you discuss any questions you have with your health care provider.

## 2014-10-09 ENCOUNTER — Encounter (HOSPITAL_COMMUNITY): Payer: Self-pay | Admitting: Emergency Medicine

## 2014-10-09 ENCOUNTER — Telehealth (HOSPITAL_COMMUNITY): Payer: Self-pay | Admitting: Family Medicine

## 2014-10-09 ENCOUNTER — Emergency Department (HOSPITAL_COMMUNITY)
Admission: EM | Admit: 2014-10-09 | Discharge: 2014-10-09 | Disposition: A | Discharge status: Home / Self Care | Payer: BLUE CROSS/BLUE SHIELD | Source: Home / Self Care | Attending: Family Medicine | Admitting: Family Medicine

## 2014-10-09 ENCOUNTER — Encounter: Payer: Self-pay | Admitting: Hematology and Oncology

## 2014-10-09 DIAGNOSIS — M542 Cervicalgia: Secondary | ICD-10-CM

## 2014-10-09 DIAGNOSIS — C9110 Chronic lymphocytic leukemia of B-cell type not having achieved remission: Secondary | ICD-10-CM

## 2014-10-09 LAB — CBC WITH DIFFERENTIAL/PLATELET
BASOS ABS: 0 10*3/uL (ref 0.0–0.1)
Basophils Relative: 0 % (ref 0–1)
EOS ABS: 0 10*3/uL (ref 0.0–0.7)
Eosinophils Relative: 0 % (ref 0–5)
HEMATOCRIT: 39.3 % (ref 36.0–46.0)
HEMOGLOBIN: 13.5 g/dL (ref 12.0–15.0)
Lymphocytes Relative: 76 % — ABNORMAL HIGH (ref 12–46)
Lymphs Abs: 19.4 10*3/uL — ABNORMAL HIGH (ref 0.7–4.0)
MCH: 31.4 pg (ref 26.0–34.0)
MCHC: 34.4 g/dL (ref 30.0–36.0)
MCV: 91.4 fL (ref 78.0–100.0)
MONO ABS: 0.8 10*3/uL (ref 0.1–1.0)
Monocytes Relative: 3 % (ref 3–12)
NEUTROS ABS: 5.4 10*3/uL (ref 1.7–7.7)
Neutrophils Relative %: 21 % — ABNORMAL LOW (ref 43–77)
PLATELETS: 207 10*3/uL (ref 150–400)
RBC: 4.3 MIL/uL (ref 3.87–5.11)
RDW: 12.5 % (ref 11.5–15.5)
WBC: 25.6 10*3/uL — AB (ref 4.0–10.5)

## 2014-10-09 NOTE — ED Provider Notes (Signed)
Kelly [name] is a 59 y.o. female who presents to Urgent Care today for left-sided neck pain. Patient has pain at the left mastoid and occipital area. Symptoms of been present for 2 days. Pain is worse in the morning and gets better throughout the day. No radiating pain weakness or numbness. No fevers or chills.    Patient has a history of CLL. This was diagnosed about a year ago. It is indolent and currently managed with observation only. She had a CBC done 2 weeks ago primary care provider office that showed a white blood cell count of 22. This is increased from her baseline of around 10 or 11.   Past Medical History  Diagnosis Date  . Thyroid disease   . Anxiety   . Allergy   . Hypertension   . Hyperlipidemia   . Leukocytosis, unspecified 09/14/2013  . CLL (chronic lymphocytic leukemia) 09/15/2013  . Internal hemorrhoids 05/11/07    Dr Earlean Shawl   Past Surgical History  Procedure Laterality Date  . Breast surgery Right     benign lesion   History  Substance Use Topics  . Smoking status: Never Smoker   . Smokeless tobacco: Never Used  . Alcohol Use: Yes     Comment: rarely   ROS as above Medications: No current facility-administered medications for this encounter.   Current Outpatient Prescriptions  Medication Sig Dispense Refill  . ALPRAZolam (XANAX) 0.25 MG tablet Take 1 tablet (0.25 mg total) by mouth at bedtime as needed. 30 tablet 1  . aspirin 81 MG tablet Take 81 mg by mouth daily.    . cholecalciferol (VITAMIN D) 1000 UNITS tablet Take 1,000 Units by mouth daily.    Marland Kitchen estradiol (VIVELLE-DOT) 0.1 MG/24HR Place 0.5 patches onto the skin 2 (two) times a week.     Marland Kitchen ipratropium (ATROVENT) 0.03 % nasal spray Place 2 sprays into both nostrils 2 (two) times daily. 30 mL 0  . levothyroxine (SYNTHROID, LEVOTHROID) 50 MCG tablet TAKE 1 TABLET BY MOUTH EVERY DAY. 90 tablet 1  . lisinopril (PRINIVIL,ZESTRIL) 5 MG tablet Take 1 tablet (5 mg total) by mouth daily. PATIENT NEEDS  OFFICE VISIT FOR ADDITIONAL REFILLS 90 tablet 1  . progesterone (PROMETRIUM) 100 MG capsule Take 100 mg by mouth daily.    . rosuvastatin (CRESTOR) 10 MG tablet Take 0.5 tablets (5 mg total) by mouth daily. 1/2 tab qd 90 tablet 1   No Known Allergies   Exam:  BP 108/71 mmHg  Pulse 95  Temp(Src) 98.2 F (36.8 C) (Oral)  Resp 16  SpO2 98% Gen: Well NAD HEENT: EOMI,  MMM mild posterior radicular lymphadenopathy. Lymph node is about 1 cm and mildly tender. Patient is also mildly tender at the insertion of the sternocleidomastoid on the left side. Tympanic membranes are normal appearing bilaterally Lungs: Normal work of breathing. CTABL Heart: RRR no MRG Abd: NABS, Soft. Nondistended, Nontender Exts: Brisk capillary refill, warm and well perfused.  Neck: Nontender to spinal midline. Tender left cervical and mastoid as noted above. Normal neck range of motion Neuro: Upper extremity strength sensation reflexes are equal and normal bilaterally.  Results for orders placed or performed during the hospital encounter of 10/09/14 (from the past 24 hour(s))  CBC with Differential     Status: Abnormal   Collection Time: 10/09/14 12:54 PM  Result Value Ref Range   WBC 25.6 (H) 4.0 - 10.5 K/uL   RBC 4.30 3.87 - 5.11 MIL/uL   Hemoglobin 13.5 12.0 - 15.0  g/dL   HCT 39.3 36.0 - 46.0 %   MCV 91.4 78.0 - 100.0 fL   MCH 31.4 26.0 - 34.0 pg   MCHC 34.4 30.0 - 36.0 g/dL   RDW 12.5 11.5 - 15.5 %   Platelets 207 150 - 400 K/uL   Neutrophils Relative % 21 (L) 43 - 77 %   Lymphocytes Relative 76 (H) 12 - 46 %   Monocytes Relative 3 3 - 12 %   Eosinophils Relative 0 0 - 5 %   Basophils Relative 0 0 - 1 %   Neutro Abs 5.4 1.7 - 7.7 K/uL   Lymphs Abs 19.4 (H) 0.7 - 4.0 K/uL   Monocytes Absolute 0.8 0.1 - 1.0 K/uL   Eosinophils Absolute 0.0 0.0 - 0.7 K/uL   Basophils Absolute 0.0 0.0 - 0.1 K/uL   WBC Morphology ATYPICAL LYMPHOCYTES    No results found.  Assessment and Plan: 59 y.o. female with pain  at the insertion of the sternocleidomastoid. This is likely myofascial musculoskeletal type pain. However patient does have a mildly swollen lymph node in that area. She has chronic lymphocytic leukemia with a increasing white blood cell count. We'll recheck CBC with differential today. Treat with NSAIDs and watchful waiting.  Discussed warning signs or symptoms. Please see discharge instructions. Patient expresses understanding.   Addendum:  CBC results as above. Please see phone note. Advised patient to contact her oncologist.    Gregor Hams, MD 10/09/14 1356

## 2014-10-09 NOTE — ED Notes (Signed)
Reports pain left side of head/neck .  Has noticed this pain for the last 2 mornings.  Pain decreases throughout the day.  No known injury

## 2014-10-09 NOTE — Discharge Instructions (Signed)
Thank you for coming in today. Take Tylenol or Aleve for pain. Use a heating pad. Come back as needed. I will call with results of the blood draw.  Cervical Strain and Sprain (Whiplash) with Rehab Cervical strain and sprain are injuries that commonly occur with "whiplash" injuries. Whiplash occurs when the neck is forcefully whipped backward or forward, such as during a motor vehicle accident or during contact sports. The muscles, ligaments, tendons, discs, and nerves of the neck are susceptible to injury when this occurs. RISK FACTORS Risk of having a whiplash injury increases if:  Osteoarthritis of the spine.  Situations that make head or neck accidents or trauma more likely.  High-risk sports (football, rugby, wrestling, hockey, auto racing, gymnastics, diving, contact karate, or boxing).  Poor strength and flexibility of the neck.  Previous neck injury.  Poor tackling technique.  Improperly fitted or padded equipment. SYMPTOMS   Pain or stiffness in the front or back of neck or both.  Symptoms may present immediately or up to 24 hours after injury.  Dizziness, headache, nausea, and vomiting.  Muscle spasm with soreness and stiffness in the neck.  Tenderness and swelling at the injury site. PREVENTION  Learn and use proper technique (avoid tackling with the head, spearing, and head-butting; use proper falling techniques to avoid landing on the head).  Warm up and stretch properly before activity.  Maintain physical fitness:  Strength, flexibility, and endurance.  Cardiovascular fitness.  Wear properly fitted and padded protective equipment, such as padded soft collars, for participation in contact sports. PROGNOSIS  Recovery from cervical strain and sprain injuries is dependent on the extent of the injury. These injuries are usually curable in 1 week to 3 months with appropriate treatment.  RELATED COMPLICATIONS   Temporary numbness and weakness may occur if  the nerve roots are damaged, and this may persist until the nerve has completely healed.  Chronic pain due to frequent recurrence of symptoms.  Prolonged healing, especially if activity is resumed too soon (before complete recovery). TREATMENT  Treatment initially involves the use of ice and medication to help reduce pain and inflammation. It is also important to perform strengthening and stretching exercises and modify activities that worsen symptoms so the injury does not get worse. These exercises may be performed at home or with a therapist. For patients who experience severe symptoms, a soft, padded collar may be recommended to be worn around the neck.  Improving your posture may help reduce symptoms. Posture improvement includes pulling your chin and abdomen in while sitting or standing. If you are sitting, sit in a firm chair with your buttocks against the back of the chair. While sleeping, try replacing your pillow with a small towel rolled to 2 inches in diameter, or use a cervical pillow or soft cervical collar. Poor sleeping positions delay healing.  For patients with nerve root damage, which causes numbness or weakness, the use of a cervical traction apparatus may be recommended. Surgery is rarely necessary for these injuries. However, cervical strain and sprains that are present at birth (congenital) may require surgery. MEDICATION   If pain medication is necessary, nonsteroidal anti-inflammatory medications, such as aspirin and ibuprofen, or other minor pain relievers, such as acetaminophen, are often recommended.  Do not take pain medication for 7 days before surgery.  Prescription pain relievers may be given if deemed necessary by your caregiver. Use only as directed and only as much as you need. HEAT AND COLD:   Cold treatment (icing) relieves  pain and reduces inflammation. Cold treatment should be applied for 10 to 15 minutes every 2 to 3 hours for inflammation and pain and  immediately after any activity that aggravates your symptoms. Use ice packs or an ice massage.  Heat treatment may be used prior to performing the stretching and strengthening activities prescribed by your caregiver, physical therapist, or athletic trainer. Use a heat pack or a warm soak. SEEK MEDICAL CARE IF:   Symptoms get worse or do not improve in 2 weeks despite treatment.  New, unexplained symptoms develop (drugs used in treatment may produce side effects). EXERCISES RANGE OF MOTION (ROM) AND STRETCHING EXERCISES - Cervical Strain and Sprain These exercises may help you when beginning to rehabilitate your injury. In order to successfully resolve your symptoms, you must improve your posture. These exercises are designed to help reduce the forward-head and rounded-shoulder posture which contributes to this condition. Your symptoms may resolve with or without further involvement from your physician, physical therapist or athletic trainer. While completing these exercises, remember:   Restoring tissue flexibility helps normal motion to return to the joints. This allows healthier, less painful movement and activity.  An effective stretch should be held for at least 20 seconds, although you may need to begin with shorter hold times for comfort.  A stretch should never be painful. You should only feel a gentle lengthening or release in the stretched tissue. STRETCH- Axial Extensors  Lie on your back on the floor. You may bend your knees for comfort. Place a rolled-up hand towel or dish towel, about 2 inches in diameter, under the part of your head that makes contact with the floor.  Gently tuck your chin, as if trying to make a "double chin," until you feel a gentle stretch at the base of your head.  Hold __________ seconds. Repeat __________ times. Complete this exercise __________ times per day.  STRETCH - Axial Extension   Stand or sit on a firm surface. Assume a good posture: chest up,  shoulders drawn back, abdominal muscles slightly tense, knees unlocked (if standing) and feet hip width apart.  Slowly retract your chin so your head slides back and your chin slightly lowers. Continue to look straight ahead.  You should feel a gentle stretch in the back of your head. Be certain not to feel an aggressive stretch since this can cause headaches later.  Hold for __________ seconds. Repeat __________ times. Complete this exercise __________ times per day. STRETCH - Cervical Side Bend   Stand or sit on a firm surface. Assume a good posture: chest up, shoulders drawn back, abdominal muscles slightly tense, knees unlocked (if standing) and feet hip width apart.  Without letting your nose or shoulders move, slowly tip your right / left ear to your shoulder until your feel a gentle stretch in the muscles on the opposite side of your neck.  Hold __________ seconds. Repeat __________ times. Complete this exercise __________ times per day. STRETCH - Cervical Rotators   Stand or sit on a firm surface. Assume a good posture: chest up, shoulders drawn back, abdominal muscles slightly tense, knees unlocked (if standing) and feet hip width apart.  Keeping your eyes level with the ground, slowly turn your head until you feel a gentle stretch along the back and opposite side of your neck.  Hold __________ seconds. Repeat __________ times. Complete this exercise __________ times per day. RANGE OF MOTION - Neck Circles   Stand or sit on a firm surface. Assume a  good posture: chest up, shoulders drawn back, abdominal muscles slightly tense, knees unlocked (if standing) and feet hip width apart.  Gently roll your head down and around from the back of one shoulder to the back of the other. The motion should never be forced or painful.  Repeat the motion 10-20 times, or until you feel the neck muscles relax and loosen. Repeat __________ times. Complete the exercise __________ times per  day. STRENGTHENING EXERCISES - Cervical Strain and Sprain These exercises may help you when beginning to rehabilitate your injury. They may resolve your symptoms with or without further involvement from your physician, physical therapist, or athletic trainer. While completing these exercises, remember:   Muscles can gain both the endurance and the strength needed for everyday activities through controlled exercises.  Complete these exercises as instructed by your physician, physical therapist, or athletic trainer. Progress the resistance and repetitions only as guided.  You may experience muscle soreness or fatigue, but the pain or discomfort you are trying to eliminate should never worsen during these exercises. If this pain does worsen, stop and make certain you are following the directions exactly. If the pain is still present after adjustments, discontinue the exercise until you can discuss the trouble with your clinician. STRENGTH - Cervical Flexors, Isometric  Face a wall, standing about 6 inches away. Place a small pillow, a ball about 6-8 inches in diameter, or a folded towel between your forehead and the wall.  Slightly tuck your chin and gently push your forehead into the soft object. Push only with mild to moderate intensity, building up tension gradually. Keep your jaw and forehead relaxed.  Hold 10 to 20 seconds. Keep your breathing relaxed.  Release the tension slowly. Relax your neck muscles completely before you start the next repetition. Repeat __________ times. Complete this exercise __________ times per day. STRENGTH- Cervical Lateral Flexors, Isometric   Stand about 6 inches away from a wall. Place a small pillow, a ball about 6-8 inches in diameter, or a folded towel between the side of your head and the wall.  Slightly tuck your chin and gently tilt your head into the soft object. Push only with mild to moderate intensity, building up tension gradually. Keep your jaw and  forehead relaxed.  Hold 10 to 20 seconds. Keep your breathing relaxed.  Release the tension slowly. Relax your neck muscles completely before you start the next repetition. Repeat __________ times. Complete this exercise __________ times per day. STRENGTH - Cervical Extensors, Isometric   Stand about 6 inches away from a wall. Place a small pillow, a ball about 6-8 inches in diameter, or a folded towel between the back of your head and the wall.  Slightly tuck your chin and gently tilt your head back into the soft object. Push only with mild to moderate intensity, building up tension gradually. Keep your jaw and forehead relaxed.  Hold 10 to 20 seconds. Keep your breathing relaxed.  Release the tension slowly. Relax your neck muscles completely before you start the next repetition. Repeat __________ times. Complete this exercise __________ times per day. POSTURE AND BODY MECHANICS CONSIDERATIONS - Cervical Strain and Sprain Keeping correct posture when sitting, standing or completing your activities will reduce the stress put on different body tissues, allowing injured tissues a chance to heal and limiting painful experiences. The following are general guidelines for improved posture. Your physician or physical therapist will provide you with any instructions specific to your needs. While reading these guidelines, remember:  The exercises prescribed by your provider will help you have the flexibility and strength to maintain correct postures.  The correct posture provides the optimal environment for your joints to work. All of your joints have less wear and tear when properly supported by a spine with good posture. This means you will experience a healthier, less painful body.  Correct posture must be practiced with all of your activities, especially prolonged sitting and standing. Correct posture is as important when doing repetitive low-stress activities (typing) as it is when doing a single  heavy-load activity (lifting). PROLONGED STANDING WHILE SLIGHTLY LEANING FORWARD When completing a task that requires you to lean forward while standing in one place for a long time, place either foot up on a stationary 2- to 4-inch high object to help maintain the best posture. When both feet are on the ground, the low back tends to lose its slight inward curve. If this curve flattens (or becomes too large), then the back and your other joints will experience too much stress, fatigue more quickly, and can cause pain.  RESTING POSITIONS Consider which positions are most painful for you when choosing a resting position. If you have pain with flexion-based activities (sitting, bending, stooping, squatting), choose a position that allows you to rest in a less flexed posture. You would want to avoid curling into a fetal position on your side. If your pain worsens with extension-based activities (prolonged standing, working overhead), avoid resting in an extended position such as sleeping on your stomach. Most people will find more comfort when they rest with their spine in a more neutral position, neither too rounded nor too arched. Lying on a non-sagging bed on your side with a pillow between your knees, or on your back with a pillow under your knees will often provide some relief. Keep in mind, being in any one position for a prolonged period of time, no matter how correct your posture, can still lead to stiffness. WALKING Walk with an upright posture. Your ears, shoulders, and hips should all line up. OFFICE WORK When working at a desk, create an environment that supports good, upright posture. Without extra support, muscles fatigue and lead to excessive strain on joints and other tissues. CHAIR:  A chair should be able to slide under your desk when your back makes contact with the back of the chair. This allows you to work closely.  The chair's height should allow your eyes to be level with the upper  part of your monitor and your hands to be slightly lower than your elbows.  Body position:  Your feet should make contact with the floor. If this is not possible, use a foot rest.  Keep your ears over your shoulders. This will reduce stress on your neck and low back. Document Released: 09/09/2005 Document Revised: 01/24/2014 Document Reviewed: 12/22/2008 [facility] Patient Information 2015 [place], [place]. This information is not intended to replace advice given to you by your health care provider. Make sure you discuss any questions you have with your health care provider.

## 2014-10-09 NOTE — ED Notes (Signed)
Called patient back about her white blood cell count results. WBC trend is increasing. Advised patient to contact her oncologist for further direction.  Gregor Hams, MD 10/09/14 1356

## 2014-10-10 LAB — PATHOLOGIST SMEAR REVIEW

## 2014-10-10 NOTE — Telephone Encounter (Signed)
I reviewed with the patient over the telephone. I told her that the worsening leukocytosis and the mild swollen lymph node could be infection related. I am comfortable to wait until March to see her back. The patient is reassured.

## 2014-12-15 ENCOUNTER — Other Ambulatory Visit (HOSPITAL_BASED_OUTPATIENT_CLINIC_OR_DEPARTMENT_OTHER): Payer: BLUE CROSS/BLUE SHIELD

## 2014-12-15 ENCOUNTER — Telehealth: Payer: Self-pay | Admitting: Hematology and Oncology

## 2014-12-15 ENCOUNTER — Encounter: Payer: Self-pay | Admitting: Hematology and Oncology

## 2014-12-15 ENCOUNTER — Ambulatory Visit (HOSPITAL_BASED_OUTPATIENT_CLINIC_OR_DEPARTMENT_OTHER): Payer: BLUE CROSS/BLUE SHIELD | Admitting: Hematology and Oncology

## 2014-12-15 VITALS — BP 125/78 | HR 98 | Temp 98.2°F | Resp 18 | Ht 65.25 in | Wt 161.7 lb

## 2014-12-15 DIAGNOSIS — C9110 Chronic lymphocytic leukemia of B-cell type not having achieved remission: Secondary | ICD-10-CM

## 2014-12-15 LAB — COMPREHENSIVE METABOLIC PANEL (CC13)
ALK PHOS: 51 U/L (ref 40–150)
ALT: 12 U/L (ref 0–55)
AST: 15 U/L (ref 5–34)
Albumin: 4.2 g/dL (ref 3.5–5.0)
Anion Gap: 11 mEq/L (ref 3–11)
BILIRUBIN TOTAL: 0.49 mg/dL (ref 0.20–1.20)
BUN: 14.6 mg/dL (ref 7.0–26.0)
CO2: 25 mEq/L (ref 22–29)
Calcium: 9.5 mg/dL (ref 8.4–10.4)
Chloride: 104 mEq/L (ref 98–109)
Creatinine: 0.9 mg/dL (ref 0.6–1.1)
EGFR: 71 mL/min/{1.73_m2} — AB (ref >90)
GLUCOSE: 108 mg/dL (ref 70–140)
POTASSIUM: 4.4 meq/L (ref 3.5–5.1)
SODIUM: 140 meq/L (ref 136–145)
TOTAL PROTEIN: 7.1 g/dL (ref 6.4–8.3)

## 2014-12-15 LAB — TECHNOLOGIST REVIEW

## 2014-12-15 LAB — LACTATE DEHYDROGENASE (CC13): LDH: 143 U/L (ref 125–245)

## 2014-12-15 LAB — CBC WITH DIFFERENTIAL/PLATELET
BASO%: 0.2 % (ref 0.0–2.0)
Basophils Absolute: 0 10*3/uL (ref 0.0–0.1)
EOS ABS: 0.1 10*3/uL (ref 0.0–0.5)
EOS%: 0.5 % (ref 0.0–7.0)
HCT: 39.8 % (ref 34.8–46.6)
HGB: 13.4 g/dL (ref 11.6–15.9)
LYMPH%: 76.5 % — ABNORMAL HIGH (ref 14.0–49.7)
MCH: 31.7 pg (ref 25.1–34.0)
MCHC: 33.7 g/dL (ref 31.5–36.0)
MCV: 94.1 fL (ref 79.5–101.0)
MONO#: 0.7 10*3/uL (ref 0.1–0.9)
MONO%: 3.2 % (ref 0.0–14.0)
NEUT%: 19.6 % — ABNORMAL LOW (ref 38.4–76.8)
NEUTROS ABS: 4.5 10*3/uL (ref 1.5–6.5)
Platelets: 194 10*3/uL (ref 145–400)
RBC: 4.23 10*6/uL (ref 3.70–5.45)
RDW: 12.8 % (ref 11.2–14.5)
WBC: 23 10*3/uL — ABNORMAL HIGH (ref 3.9–10.3)
lymph#: 17.6 10*3/uL — ABNORMAL HIGH (ref 0.9–3.3)

## 2014-12-15 NOTE — Telephone Encounter (Signed)
Lft msg for pt confirming labs/ov per 03/24 POF, mailed schedule to pt.... KJ

## 2014-12-15 NOTE — Progress Notes (Signed)
[place] OFFICE PROGRESS NOTE  Patient Care Team: Fara Chute, PA-C as PCP - General (Physician Assistant) West Pugh, NP as Nurse Practitioner (Gynecology) Heath Lark, MD as Consulting Physician (Hematology and Oncology) Velna Hatchet, MD as Consulting Physician (Internal Medicine)  SUMMARY OF ONCOLOGIC HISTORY: Oncology History   Rai Stage 0     CLL (chronic lymphocytic leukemia)   09/14/2013 Procedure Flow cytometry confirmed diagnosis of CLL. The monoclonal kappa positive B cell stained positive for CD5, CD19, CD20 and CD23 and negative for lambda light chain, CD10 and FMC-7   09/15/2013 Initial Diagnosis CLL (chronic lymphocytic leukemia)    INTERVAL HISTORY: Please see below for problem oriented charting. The patient returns for further follow-up. She denies new lymphadenopathy. No new infection. She feels well. She is up-to-date with all preventive healthcare measures. REVIEW OF SYSTEMS:   Constitutional: Denies fevers, chills or abnormal weight loss Eyes: Denies blurriness of vision Ears, nose, mouth, throat, and face: Denies mucositis or sore throat Respiratory: Denies cough, dyspnea or wheezes Cardiovascular: Denies palpitation, chest discomfort or lower extremity swelling Gastrointestinal:  Denies nausea, heartburn or change in bowel habits Skin: Denies abnormal skin rashes Lymphatics: Denies new lymphadenopathy or easy bruising Neurological:Denies numbness, tingling or new weaknesses Behavioral/Psych: Mood is stable, no new changes  All other systems were reviewed with the patient and are negative.  I have reviewed the past medical history, past surgical history, social history and family history with the patient and they are unchanged from previous note.  ALLERGIES:  has No Known Allergies.  MEDICATIONS:  Current Outpatient Prescriptions  Medication Sig Dispense Refill  . ALPRAZolam (XANAX) 0.25 MG tablet Take 1 tablet (0.25 mg  total) by mouth at bedtime as needed. 30 tablet 1  . aspirin 81 MG tablet Take 81 mg by mouth daily.    . cholecalciferol (VITAMIN D) 1000 UNITS tablet Take 1,000 Units by mouth daily.    Marland Kitchen levothyroxine (SYNTHROID, LEVOTHROID) 50 MCG tablet TAKE 1 TABLET BY MOUTH EVERY DAY. 90 tablet 1  . lisinopril (PRINIVIL,ZESTRIL) 5 MG tablet Take 1 tablet (5 mg total) by mouth daily. PATIENT NEEDS OFFICE VISIT FOR ADDITIONAL REFILLS 90 tablet 1  . MINIVELLE 0.075 MG/24HR Place 0.5 patches onto the skin 2 (two) times a week.  11  . progesterone (PROMETRIUM) 100 MG capsule Take 100 mg by mouth daily.    . rosuvastatin (CRESTOR) 10 MG tablet Take 0.5 tablets (5 mg total) by mouth daily. 1/2 tab qd 90 tablet 1   No current facility-administered medications for this visit.    PHYSICAL EXAMINATION: ECOG PERFORMANCE STATUS: 0 - Asymptomatic  Filed Vitals:   12/15/14 1300  BP: 125/78  Pulse: 98  Temp: 98.2 F (36.8 C)  Resp: 18   Filed Weights   12/15/14 1300  Weight: 161 lb 11.2 oz (73.347 kg)    GENERAL:alert, no distress and comfortable SKIN: skin color, texture, turgor are normal, no rashes or significant lesions EYES: normal, Conjunctiva are pink and non-injected, sclera clear OROPHARYNX:no exudate, no erythema and lips, buccal mucosa, and tongue normal  NECK: supple, thyroid normal size, non-tender, without nodularity LYMPH:  no palpable lymphadenopathy in the cervical, axillary or inguinal LUNGS: clear to auscultation and percussion with normal breathing effort HEART: regular rate & rhythm and no murmurs and no lower extremity edema ABDOMEN:abdomen soft, non-tender and normal bowel sounds Musculoskeletal:no cyanosis of digits and no clubbing  NEURO: alert & oriented x 3 with fluent speech, no focal motor/sensory deficits  LABORATORY DATA:  I have reviewed the data as listed    Component Value Date/Time   NA 140 12/15/2014 1250   NA 138 09/22/2014 1054   K 4.4 12/15/2014 1250   K  4.2 09/22/2014 1054   CL 101 09/22/2014 1054   CO2 25 12/15/2014 1250   CO2 27 09/22/2014 1054   GLUCOSE 108 12/15/2014 1250   GLUCOSE 98 09/22/2014 1054   BUN 14.6 12/15/2014 1250   BUN 11 09/22/2014 1054   CREATININE 0.9 12/15/2014 1250   CREATININE 0.91 09/22/2014 1054   CREATININE 0.9 08/28/2010 0650   CALCIUM 9.5 12/15/2014 1250   CALCIUM 9.6 09/22/2014 1054   PROT 7.1 12/15/2014 1250   PROT 7.1 09/22/2014 1054   ALBUMIN 4.2 12/15/2014 1250   ALBUMIN 4.6 09/22/2014 1054   AST 15 12/15/2014 1250   AST 14 09/22/2014 1054   ALT 12 12/15/2014 1250   ALT 10 09/22/2014 1054   ALKPHOS 51 12/15/2014 1250   ALKPHOS 51 09/22/2014 1054   BILITOT 0.49 12/15/2014 1250   BILITOT 0.5 09/22/2014 1054    No results found for: SPEP, UPEP  Lab Results  Component Value Date   WBC 23.0* 12/15/2014   NEUTROABS 4.5 12/15/2014   HGB 13.4 12/15/2014   HCT 39.8 12/15/2014   MCV 94.1 12/15/2014   PLT 194 12/15/2014      Chemistry      Component Value Date/Time   NA 140 12/15/2014 1250   NA 138 09/22/2014 1054   K 4.4 12/15/2014 1250   K 4.2 09/22/2014 1054   CL 101 09/22/2014 1054   CO2 25 12/15/2014 1250   CO2 27 09/22/2014 1054   BUN 14.6 12/15/2014 1250   BUN 11 09/22/2014 1054   CREATININE 0.9 12/15/2014 1250   CREATININE 0.91 09/22/2014 1054   CREATININE 0.9 08/28/2010 0650      Component Value Date/Time   CALCIUM 9.5 12/15/2014 1250   CALCIUM 9.6 09/22/2014 1054   ALKPHOS 51 12/15/2014 1250   ALKPHOS 51 09/22/2014 1054   AST 15 12/15/2014 1250   AST 14 09/22/2014 1054   ALT 12 12/15/2014 1250   ALT 10 09/22/2014 1054   BILITOT 0.49 12/15/2014 1250   BILITOT 0.5 09/22/2014 1054      ASSESSMENT & PLAN:  CLL (chronic lymphocytic leukemia) Clinically, she has no signs of disease progression. She will remain at stage 0. I continue to reinforce the importance of preventive measures. I will see her on a yearly basis with history, physical examination and blood  work. The patient will qualify for Prevnar 13 vaccination in the recommend for her to get it when she see her PCP in her next visit.    Orders Placed This Encounter  Procedures  . CBC with Differential/Platelet    Standing Status: Future     Number of Occurrences:      Standing Expiration Date: 01/19/2016  . Comprehensive metabolic panel    Standing Status: Future     Number of Occurrences:      Standing Expiration Date: 01/19/2016  . Lactate dehydrogenase    Standing Status: Future     Number of Occurrences:      Standing Expiration Date: 01/19/2016   All questions were answered. The patient knows to call the clinic with any problems, questions or concerns. No barriers to learning was detected. I spent 15 minutes counseling the patient face to face. The total time spent in the appointment was 20 minutes and more than 50%  was on counseling and review of test results     [facility], Janin Kozlowski, MD 12/15/2014 2:15 PM

## 2014-12-15 NOTE — Assessment & Plan Note (Signed)
Clinically, she has no signs of disease progression. She will remain at stage 0. I continue to reinforce the importance of preventive measures. I will see her on a yearly basis with history, physical examination and blood work. The patient will qualify for Prevnar 13 vaccination in the recommend for her to get it when she see her PCP in her next visit.

## 2014-12-16 ENCOUNTER — Encounter: Payer: Self-pay | Admitting: Hematology and Oncology

## 2015-04-02 ENCOUNTER — Ambulatory Visit (INDEPENDENT_AMBULATORY_CARE_PROVIDER_SITE_OTHER): Payer: BLUE CROSS/BLUE SHIELD | Admitting: Family Medicine

## 2015-04-02 VITALS — BP 116/72 | HR 72 | Temp 98.2°F | Resp 18 | Ht 65.0 in | Wt 161.0 lb

## 2015-04-02 DIAGNOSIS — H6503 Acute serous otitis media, bilateral: Secondary | ICD-10-CM | POA: Diagnosis not present

## 2015-04-02 MED ORDER — MOMETASONE FUROATE 50 MCG/ACT NA SUSP: 2.0000 | Freq: Every day | NASAL | Status: DC

## 2015-04-02 NOTE — Progress Notes (Signed)
° °  Subjective:    Patient ID: Kelly [name], female    DOB: 07-09-1956, 59 y.o.   MRN: [id number] This chart was scribed for Robyn Haber, MD by Zola Button, Medical Scribe. This patient was seen in Room 8 and the patient's care was started at 1:36 PM.   HPI HPI Comments: Kelly [name] is a 59 y.o. female who presents to the [facility] complaining of gradual onset bilateral ear fullness, worse in her right ear, that started a few days ago. She has had some hearing loss with this. Patient also reports having some sinus issues. She had had a cough for several weeks and was seen a week ago at [facility]; she was told she had a URI with bronchitis, and she was put on antibiotics and prednisone. The cough is mostly resolved now. Patient has been using Flonase regularly for the past month.  Patient works as an Optometrist at a local firm.  Review of Systems  HENT: Positive for hearing loss.   Respiratory: Positive for cough.        Objective:   Physical Exam CONSTITUTIONAL: Well developed/well nourished HEAD: Normocephalic/atraumatic EYES: EOM/PERRL ENMT: Mucous membranes moist, both eardrums are markedly retracted but have normal color and transparency. The nose shows no mucopurulent discharge and there is good patency. NECK: supple no meningeal signs, no adenopathy GU: no cva tenderness NEURO: Pt is awake/alert, moves all extremitiesx4 EXTREMITIES: pulses normal, full ROM SKIN: warm, color normal PSYCH: no abnormalities of mood noted     Assessment & Plan:   This chart was scribed in my presence and reviewed by me personally.    ICD-9-CM ICD-10-CM   1. Bilateral acute serous otitis media, recurrence not specified 381.01 H65.03 mometasone (NASONEX) 50 MCG/ACT nasal spray     Signed, Robyn Haber, MD

## 2015-04-02 NOTE — Patient Instructions (Signed)
Usually this takes several days to resolve. We will change your nasal steroidal spray to more affected one, and suggest that you use Afrin at night for the next 3 nights, and then finally several times a day try to pop your ears with gentle pressure.     Serous Otitis Media Serous otitis media is fluid in the middle ear space. This space contains the bones for hearing and air. Air in the middle ear space helps to transmit sound.  The air gets there through the eustachian tube. This tube goes from the back of the nose (nasopharynx) to the middle ear space. It keeps the pressure in the middle ear the same as the outside world. It also helps to drain fluid from the middle ear space. CAUSES  Serous otitis media occurs when the eustachian tube gets blocked. Blockage can come from:  Ear infections.  Colds and other upper respiratory infections.  Allergies.  Irritants such as cigarette smoke.  Sudden changes in air pressure (such as descending in an airplane).  Enlarged adenoids.  A mass in the nasopharynx. During colds and upper respiratory infections, the middle ear space can become temporarily filled with fluid. This can happen after an ear infection also. Once the infection clears, the fluid will generally drain out of the ear through the eustachian tube. If it does not, then serous otitis media occurs. SIGNS AND SYMPTOMS   Hearing loss.  A feeling of fullness in the ear, without pain.  Young children may not show any symptoms but may show slight behavioral changes, such as agitation, ear pulling, or crying. DIAGNOSIS  Serous otitis media is diagnosed by an ear exam. Tests may be done to check on the movement of the eardrum. Hearing exams may also be done. TREATMENT  The fluid most often goes away without treatment. If allergy is the cause, allergy treatment may be helpful. Fluid that persists for several months may require minor surgery. A small tube is placed in the eardrum  to:  Drain the fluid.  Restore the air in the middle ear space. In certain situations, antibiotic medicines are used to avoid surgery. Surgery may be done to remove enlarged adenoids (if this is the cause). HOME CARE INSTRUCTIONS   Keep children away from tobacco smoke.  Keep all follow-up visits as directed by your health care provider. SEEK MEDICAL CARE IF:   Your hearing is not better in 3 months.  Your hearing is worse.  You have ear pain.  You have drainage from the ear.  You have dizziness.  You have serous otitis media only in one ear or have any bleeding from your nose (epistaxis).  You notice a lump on your neck. MAKE SURE YOU:  Understand these instructions.   Will watch your condition.   Will get help right away if you are not doing well or get worse.  Document Released: 11/30/2003 Document Revised: 01/24/2014 Document Reviewed: 04/06/2013 [facility] Patient Information 2015 [place], [place]. This information is not intended to replace advice given to you by your health care provider. Make sure you discuss any questions you have with your health care provider.

## 2015-07-05 ENCOUNTER — Ambulatory Visit (INDEPENDENT_AMBULATORY_CARE_PROVIDER_SITE_OTHER): Payer: BLUE CROSS/BLUE SHIELD | Admitting: Family Medicine

## 2015-07-05 VITALS — BP 123/79 | HR 79 | Temp 98.3°F | Resp 18 | Ht 65.0 in | Wt 163.4 lb

## 2015-07-05 DIAGNOSIS — Z23 Encounter for immunization: Secondary | ICD-10-CM

## 2015-07-05 DIAGNOSIS — C9110 Chronic lymphocytic leukemia of B-cell type not having achieved remission: Secondary | ICD-10-CM | POA: Diagnosis not present

## 2015-07-05 DIAGNOSIS — E038 Other specified hypothyroidism: Secondary | ICD-10-CM

## 2015-07-05 DIAGNOSIS — R6889 Other general symptoms and signs: Secondary | ICD-10-CM | POA: Diagnosis not present

## 2015-07-05 DIAGNOSIS — E034 Atrophy of thyroid (acquired): Secondary | ICD-10-CM

## 2015-07-05 NOTE — Progress Notes (Signed)
Kelly [name] is a 59 y.o. female who presents today for concerns for hands being cold/possible illness.  Cool hands - Ongoing now for 2-3 days, denies fever, chills, sweats.  Does have hx of hypothyroidism on stable does of synthroid.  No other systemic Sx including dysuria, N/V/D, abdominal pain.  Hx of CLL followed by Oncology.     Past Medical History  Diagnosis Date  . Thyroid disease   . Anxiety   . Allergy   . Hypertension   . Hyperlipidemia   . Leukocytosis, unspecified 09/14/2013  . CLL (chronic lymphocytic leukemia) ([place]) 09/15/2013  . Internal hemorrhoids 05/11/07    Dr Earlean Shawl    History  Smoking status  . Never Smoker   Smokeless tobacco  . Never Used    Family History  Problem Relation Age of Onset  . Cancer Mother 6    pancreatic cancer  . Cancer Father 22    lung cancer; +tobacco    Current Outpatient Prescriptions on File Prior to Visit  Medication Sig Dispense Refill  . ALPRAZolam (XANAX) 0.25 MG tablet Take 1 tablet (0.25 mg total) by mouth at bedtime as needed. 30 tablet 1  . aspirin 81 MG tablet Take 81 mg by mouth daily.    . cholecalciferol (VITAMIN D) 1000 UNITS tablet Take 1,000 Units by mouth daily.    Marland Kitchen levothyroxine (SYNTHROID, LEVOTHROID) 50 MCG tablet TAKE 1 TABLET BY MOUTH EVERY DAY. 90 tablet 1  . lisinopril (PRINIVIL,ZESTRIL) 5 MG tablet Take 1 tablet (5 mg total) by mouth daily. PATIENT NEEDS OFFICE VISIT FOR ADDITIONAL REFILLS 90 tablet 1  . MINIVELLE 0.075 MG/24HR Place 0.5 patches onto the skin 2 (two) times a week.  11  . mometasone (NASONEX) 50 MCG/ACT nasal spray Place 2 sprays into the nose daily. 17 g 12  . progesterone (PROMETRIUM) 100 MG capsule Take 100 mg by mouth daily.    . rosuvastatin (CRESTOR) 10 MG tablet Take 0.5 tablets (5 mg total) by mouth daily. 1/2 tab qd 90 tablet 1   No current facility-administered medications on file prior to visit.    ROS: Per HPI.  All other systems reviewed and are negative.    Physical Exam Filed Vitals:   07/05/15 1913  BP: 123/79  Pulse: 79  Temp: 98.3 F (36.8 C)  Resp: 18    Physical Examination: General appearance - alert, well appearing, and in no distress Neck - supple, no significant adenopathy, thyroid exam: thyroid is normal in size without nodules or tenderness Chest - clear to auscultation, no wheezes, rales or rhonchi, symmetric air entry Heart - normal rate and regular rhythm, S1 and S2 normal  Assessment/Plan 1) Cold intolerance - May be 2/2 to change in weather/developing of small URI.  However, hx of hypothyroidism with CLL followed by Dr. Alvy Bimler.  Last TSH was December 2015, been on stable dose of 50 mcg daily.  Will repeat today to make sure stable levels.

## 2015-07-06 LAB — TSH: TSH: 3.139 u[IU]/mL (ref 0.350–4.500)

## 2015-07-24 ENCOUNTER — Other Ambulatory Visit: Payer: Self-pay

## 2015-07-24 DIAGNOSIS — Z1231 Encounter for screening mammogram for malignant neoplasm of breast: Secondary | ICD-10-CM

## 2015-08-20 NOTE — Progress Notes (Signed)
History and physical examinations reviewed in detail.  Agree with assessment and plan as outlined by Dr. Hess. Zury Fazzino Martin Rosina Cressler, M.D. [facility]  [place] [address] [place], [place]  [address] [phone] phone [phone] fax  

## 2015-08-24 ENCOUNTER — Ambulatory Visit
Admission: RE | Admit: 2015-08-24 | Discharge: 2015-08-24 | Disposition: A | Discharge status: Home / Self Care | Payer: 59 | Source: Ambulatory Visit

## 2015-08-24 DIAGNOSIS — Z1231 Encounter for screening mammogram for malignant neoplasm of breast: Secondary | ICD-10-CM

## 2015-10-06 ENCOUNTER — Other Ambulatory Visit: Payer: Self-pay | Admitting: Physician Assistant

## 2015-12-11 ENCOUNTER — Other Ambulatory Visit: Payer: BLUE CROSS/BLUE SHIELD

## 2015-12-11 ENCOUNTER — Ambulatory Visit: Payer: BLUE CROSS/BLUE SHIELD | Admitting: Hematology and Oncology

## 2016-01-11 ENCOUNTER — Other Ambulatory Visit (HOSPITAL_BASED_OUTPATIENT_CLINIC_OR_DEPARTMENT_OTHER): Payer: 59

## 2016-01-11 ENCOUNTER — Telehealth: Payer: Self-pay | Admitting: Hematology and Oncology

## 2016-01-11 ENCOUNTER — Encounter: Payer: Self-pay | Admitting: Hematology and Oncology

## 2016-01-11 ENCOUNTER — Ambulatory Visit (HOSPITAL_BASED_OUTPATIENT_CLINIC_OR_DEPARTMENT_OTHER): Payer: 59 | Admitting: Hematology and Oncology

## 2016-01-11 VITALS — BP 119/72 | HR 77 | Temp 98.2°F | Resp 18 | Ht 65.0 in | Wt 160.0 lb

## 2016-01-11 DIAGNOSIS — C9110 Chronic lymphocytic leukemia of B-cell type not having achieved remission: Secondary | ICD-10-CM

## 2016-01-11 LAB — CBC WITH DIFFERENTIAL/PLATELET
BASO%: 0.1 % (ref 0.0–2.0)
BASOS ABS: 0 10*3/uL (ref 0.0–0.1)
EOS%: 0.5 % (ref 0.0–7.0)
Eosinophils Absolute: 0.2 10*3/uL (ref 0.0–0.5)
HEMATOCRIT: 40.9 % (ref 34.8–46.6)
HGB: 13.5 g/dL (ref 11.6–15.9)
LYMPH%: 83.9 % — ABNORMAL HIGH (ref 14.0–49.7)
MCH: 31.3 pg (ref 25.1–34.0)
MCHC: 33 g/dL (ref 31.5–36.0)
MCV: 94.7 fL (ref 79.5–101.0)
MONO#: 0.6 10*3/uL (ref 0.1–0.9)
MONO%: 2.2 % (ref 0.0–14.0)
NEUT#: 3.6 10*3/uL (ref 1.5–6.5)
NEUT%: 13.3 % — AB (ref 38.4–76.8)
Platelets: 191 10*3/uL (ref 145–400)
RBC: 4.32 10*6/uL (ref 3.70–5.45)
RDW: 13 % (ref 11.2–14.5)
WBC: 27.4 10*3/uL — ABNORMAL HIGH (ref 3.9–10.3)
lymph#: 23 10*3/uL — ABNORMAL HIGH (ref 0.9–3.3)
nRBC: 0 % (ref 0–0)

## 2016-01-11 LAB — COMPREHENSIVE METABOLIC PANEL
ALK PHOS: 52 U/L (ref 40–150)
ALT: 9 U/L (ref 0–55)
AST: 13 U/L (ref 5–34)
Albumin: 4 g/dL (ref 3.5–5.0)
Anion Gap: 7 mEq/L (ref 3–11)
BILIRUBIN TOTAL: 0.38 mg/dL (ref 0.20–1.20)
BUN: 11.6 mg/dL (ref 7.0–26.0)
CALCIUM: 9.5 mg/dL (ref 8.4–10.4)
CO2: 26 mEq/L (ref 22–29)
CREATININE: 0.9 mg/dL (ref 0.6–1.1)
Chloride: 107 mEq/L (ref 98–109)
EGFR: 70 mL/min/{1.73_m2} — AB (ref >90)
Glucose: 100 mg/dl (ref 70–140)
Potassium: 4.5 mEq/L (ref 3.5–5.1)
Sodium: 140 mEq/L (ref 136–145)
TOTAL PROTEIN: 7 g/dL (ref 6.4–8.3)

## 2016-01-11 LAB — LACTATE DEHYDROGENASE: LDH: 130 U/L (ref 125–245)

## 2016-01-11 LAB — TECHNOLOGIST REVIEW

## 2016-01-11 NOTE — Progress Notes (Signed)
[place] OFFICE PROGRESS NOTE  Patient Care Team: Harrison Mons, PA-C as PCP - General (Physician Assistant) West Pugh, NP as Nurse Practitioner (Gynecology) Heath Lark, MD as Consulting Physician (Hematology and Oncology) Velna Hatchet, MD as Consulting Physician (Internal Medicine)  SUMMARY OF ONCOLOGIC HISTORY: Oncology History   Rai Stage 0     CLL (chronic lymphocytic leukemia) ([place])   09/14/2013 Procedure Flow cytometry confirmed diagnosis of CLL. The monoclonal kappa positive B cell stained positive for CD5, CD19, CD20 and CD23 and negative for lambda light chain, CD10 and FMC-7   09/15/2013 Initial Diagnosis CLL (chronic lymphocytic leukemia)    INTERVAL HISTORY: Please see below for problem oriented charting. She feels well apart from occasional headaches. No new lymphadenopathy. No recent infection  REVIEW OF SYSTEMS:   Constitutional: Denies fevers, chills or abnormal weight loss Eyes: Denies blurriness of vision Ears, nose, mouth, throat, and face: Denies mucositis or sore throat Respiratory: Denies cough, dyspnea or wheezes Cardiovascular: Denies palpitation, chest discomfort or lower extremity swelling Gastrointestinal:  Denies nausea, heartburn or change in bowel habits Skin: Denies abnormal skin rashes Lymphatics: Denies new lymphadenopathy or easy bruising Neurological:Denies numbness, tingling or new weaknesses Behavioral/Psych: Mood is stable, no new changes  All other systems were reviewed with the patient and are negative.  I have reviewed the past medical history, past surgical history, social history and family history with the patient and they are unchanged from previous note.  ALLERGIES:  has No Known Allergies.  MEDICATIONS:  Current Outpatient Prescriptions  Medication Sig Dispense Refill  . ALPRAZolam (XANAX) 0.25 MG tablet Take 1 tablet (0.25 mg total) by mouth at bedtime as needed. 30 tablet 1  . aspirin 81 MG  tablet Take 81 mg by mouth daily.    . cholecalciferol (VITAMIN D) 1000 UNITS tablet Take 1,000 Units by mouth daily.    Marland Kitchen levothyroxine (SYNTHROID, LEVOTHROID) 50 MCG tablet TAKE 1 TABLET BY MOUTH EVERY DAY. 90 tablet 1  . lisinopril (PRINIVIL,ZESTRIL) 5 MG tablet Take 1 tablet (5 mg total) by mouth daily. PATIENT NEEDS OFFICE VISIT FOR ADDITIONAL REFILLS 90 tablet 1  . MINIVELLE 0.075 MG/24HR Place 0.5 patches onto the skin 2 (two) times a week.  11  . mometasone (NASONEX) 50 MCG/ACT nasal spray Place 2 sprays into the nose daily. 17 g 12  . Progesterone Micronized (PROMETRIUM PO) Take 200 mg by mouth as directed. Daily for 12 days every 3 month.    . rosuvastatin (CRESTOR) 10 MG tablet Take 0.5 tablets (5 mg total) by mouth daily. 1/2 tab qd 90 tablet 1   No current facility-administered medications for this visit.    PHYSICAL EXAMINATION: ECOG PERFORMANCE STATUS: 0 - Asymptomatic  Filed Vitals:   01/11/16 1011  BP: 119/72  Pulse: 77  Temp: 98.2 F (36.8 C)  Resp: 18   Filed Weights   01/11/16 1011  Weight: 160 lb (72.576 kg)    GENERAL:alert, no distress and comfortable SKIN: skin color, texture, turgor are normal, no rashes or significant lesions EYES: normal, Conjunctiva are pink and non-injected, sclera clear OROPHARYNX:no exudate, no erythema and lips, buccal mucosa, and tongue normal  NECK: supple, thyroid normal size, non-tender, without nodularity LYMPH:  no palpable lymphadenopathy in the cervical, axillary or inguinal LUNGS: clear to auscultation and percussion with normal breathing effort HEART: regular rate & rhythm and no murmurs and no lower extremity edema ABDOMEN:abdomen soft, non-tender and normal bowel sounds Musculoskeletal:no cyanosis of digits and no clubbing  NEURO: alert & oriented x 3 with fluent speech, no focal motor/sensory deficits  LABORATORY DATA:  I have reviewed the data as listed    Component Value Date/Time   NA 140 01/11/2016 0957    NA 138 09/22/2014 1054   K 4.5 01/11/2016 0957   K 4.2 09/22/2014 1054   CL 101 09/22/2014 1054   CO2 26 01/11/2016 0957   CO2 27 09/22/2014 1054   GLUCOSE 100 01/11/2016 0957   GLUCOSE 98 09/22/2014 1054   BUN 11.6 01/11/2016 0957   BUN 11 09/22/2014 1054   CREATININE 0.9 01/11/2016 0957   CREATININE 0.91 09/22/2014 1054   CREATININE 0.9 08/28/2010 0650   CALCIUM 9.5 01/11/2016 0957   CALCIUM 9.6 09/22/2014 1054   PROT 7.0 01/11/2016 0957   PROT 7.1 09/22/2014 1054   ALBUMIN 4.0 01/11/2016 0957   ALBUMIN 4.6 09/22/2014 1054   AST 13 01/11/2016 0957   AST 14 09/22/2014 1054   ALT 9 01/11/2016 0957   ALT 10 09/22/2014 1054   ALKPHOS 52 01/11/2016 0957   ALKPHOS 51 09/22/2014 1054   BILITOT 0.38 01/11/2016 0957   BILITOT 0.5 09/22/2014 1054    No results found for: SPEP, UPEP  Lab Results  Component Value Date   WBC 27.4* 01/11/2016   NEUTROABS 3.6 01/11/2016   HGB 13.5 01/11/2016   HCT 40.9 01/11/2016   MCV 94.7 01/11/2016   PLT 191 01/11/2016      Chemistry      Component Value Date/Time   NA 140 01/11/2016 0957   NA 138 09/22/2014 1054   K 4.5 01/11/2016 0957   K 4.2 09/22/2014 1054   CL 101 09/22/2014 1054   CO2 26 01/11/2016 0957   CO2 27 09/22/2014 1054   BUN 11.6 01/11/2016 0957   BUN 11 09/22/2014 1054   CREATININE 0.9 01/11/2016 0957   CREATININE 0.91 09/22/2014 1054   CREATININE 0.9 08/28/2010 0650      Component Value Date/Time   CALCIUM 9.5 01/11/2016 0957   CALCIUM 9.6 09/22/2014 1054   ALKPHOS 52 01/11/2016 0957   ALKPHOS 51 09/22/2014 1054   AST 13 01/11/2016 0957   AST 14 09/22/2014 1054   ALT 9 01/11/2016 0957   ALT 10 09/22/2014 1054   BILITOT 0.38 01/11/2016 0957   BILITOT 0.5 09/22/2014 1054      ASSESSMENT & PLAN:  CLL (chronic lymphocytic leukemia) Clinically, she has no signs of disease progression. She will remain at stage 0. I continue to reinforce the importance of preventive measures. I will see her on a yearly basis  with history, physical examination and blood work.   Orders Placed This Encounter  Procedures  . CBC with Differential/Platelet    Standing Status: Future     Number of Occurrences:      Standing Expiration Date: 02/14/2017   All questions were answered. The patient knows to call the clinic with any problems, questions or concerns. No barriers to learning was detected. I spent 10 minutes counseling the patient face to face. The total time spent in the appointment was 15 minutes and more than 50% was on counseling and review of test results     [facility], [place], MD 01/11/2016 3:59 PM

## 2016-01-11 NOTE — Assessment & Plan Note (Signed)
Clinically, she has no signs of disease progression. She will remain at stage 0. I continue to reinforce the importance of preventive measures. I will see her on a yearly basis with history, physical examination and blood work.

## 2016-01-11 NOTE — Telephone Encounter (Signed)
Gave and printed appt sched and avs for pt for April 2018 °

## 2016-03-31 ENCOUNTER — Encounter: Payer: Self-pay | Admitting: Hematology and Oncology

## 2016-04-04 ENCOUNTER — Other Ambulatory Visit: Payer: Self-pay | Admitting: Hematology and Oncology

## 2016-04-05 ENCOUNTER — Telehealth: Payer: Self-pay | Admitting: Hematology and Oncology

## 2016-04-05 NOTE — Telephone Encounter (Signed)
Per desk nurse patient's next lab/fu should be July 2018 not July 2017. Per desk nurse NG has already cxd lab/fu for July 2017. Added lab/fu for July 2018. Left message for patient and mailed schedule.

## 2016-04-08 ENCOUNTER — Telehealth: Payer: Self-pay | Admitting: Hematology and Oncology

## 2016-04-08 NOTE — Telephone Encounter (Signed)
left msg confirming  4/19 apt per pof

## 2016-04-17 ENCOUNTER — Ambulatory Visit: Payer: 59 | Admitting: Hematology and Oncology

## 2016-04-17 ENCOUNTER — Other Ambulatory Visit: Payer: 59

## 2016-05-30 ENCOUNTER — Telehealth: Payer: Self-pay | Admitting: Gastroenterology

## 2016-05-30 NOTE — Telephone Encounter (Signed)
Left message for patient to call back  

## 2016-05-31 NOTE — Telephone Encounter (Signed)
Patient reports rectal bleeding from one BM on Monday.  NO further bleeding.  She did make an appt for October.  She is advised with one isolated incident most likely hemorrhoid source of bleeding.  She is advised to make sure she has plenty of fluid in her diet, take stool softeners if stools are hard, and high fiber diet.  If she has an increase in symptoms she is asked to call back prior to her office visit in October.

## 2016-06-26 ENCOUNTER — Ambulatory Visit (INDEPENDENT_AMBULATORY_CARE_PROVIDER_SITE_OTHER): Payer: 59 | Admitting: Gastroenterology

## 2016-06-26 ENCOUNTER — Encounter: Payer: Self-pay | Admitting: Gastroenterology

## 2016-06-26 VITALS — BP 100/70 | HR 60 | Ht 65.0 in | Wt 159.8 lb

## 2016-06-26 DIAGNOSIS — K921 Melena: Secondary | ICD-10-CM | POA: Diagnosis not present

## 2016-06-26 DIAGNOSIS — K648 Other hemorrhoids: Secondary | ICD-10-CM | POA: Diagnosis not present

## 2016-06-26 NOTE — Progress Notes (Signed)
    History of Present Illness: This is a 60 year old female with hematochezia. Patient relates that she had 3 episodes of small amounts of bright red blood per rectum with bowel movements. The symptoms occurred over the course of one week about one month ago and have not recurred. She underwent colonoscopy for the same symptoms in April 2015 showing internal hemorrhoids which were injected. She also had external hemorrhoids and mild diverticulosis. She has not had any rectal bleeding since that procedure until one month ago. Did note some mild anal discomfort at the time of rectal bleeding one month ago. She states she generally has a bowel movement every 2-3 days and it is sometimes slightly hard. She began taking Colace every other day and her stools are now softer. She denies straining or constipation.  Current Medications, Allergies, Past Medical History, Past Surgical History, Family History and Social History were reviewed in [employer] record.  Physical Exam: General: Well developed, well nourished, no acute distress Head: Normocephalic and atraumatic Eyes:  sclerae anicteric, EOMI Ears: Normal auditory acuity Mouth: No deformity or lesions Lungs: Clear throughout to auscultation Heart: Regular rate and rhythm; no murmurs, rubs or bruits Abdomen: Soft, non tender and non distended. No masses, hepatosplenomegaly or hernias noted. Normal Bowel sounds Musculoskeletal: Symmetrical with no gross deformities  Pulses:  Normal pulses noted Extremities: No clubbing, cyanosis, edema or deformities noted Neurological: Alert oriented x 4, grossly nonfocal Psychological:  Alert and cooperative. Normal mood and affect  Assessment and Recommendations:  1. Internal hemorrhoids complicated by small amounts of bleeding. Advised long-term daily or every other day Colace along with adequate water intake and high fiber diet to avoid constipation, hard stools or straining.  Preparation H suppositories daily as needed. Standard rectal care instructions. If rectal bleeding is frequently recurrent or persistent she is advised to call or schedule an office appointment to consider hemorrhoidal banding.

## 2016-06-26 NOTE — Patient Instructions (Addendum)
You can use preparation  H suppositories daily as needed for hemorrhoid symptoms.   Call our office back if your bleeding persists.   RECTAL CARE INSTRUCTIONS:  1. Sitz Baths twice a day for 10 minutes each. 2. Thoroughly clean and dry the rectum. 3. Put Tucks pad against the rectum at night. 4. Clean the rectum with Balenol lotion after each bowel movement.  High-Fiber Diet Fiber, also called dietary fiber, is a type of carbohydrate found in fruits, vegetables, whole grains, and beans. A high-fiber diet can have many health benefits. Your health care provider may recommend a high-fiber diet to help:  Prevent constipation. Fiber can make your bowel movements more regular.  Lower your cholesterol.  Relieve hemorrhoids, uncomplicated diverticulosis, or irritable bowel syndrome.  Prevent overeating as part of a weight-loss plan.  Prevent heart disease, type 2 diabetes, and certain cancers. WHAT IS MY PLAN? The recommended daily intake of fiber includes:  38 grams for men under age 92.  21 grams for men over age 61.  43 grams for women under age 74.  39 grams for women over age 61. You can get the recommended daily intake of dietary fiber by eating a variety of fruits, vegetables, grains, and beans. Your health care provider may also recommend a fiber supplement if it is not possible to get enough fiber through your diet. WHAT DO I NEED TO KNOW ABOUT A HIGH-FIBER DIET?  Fiber supplements have not been widely studied for their effectiveness, so it is better to get fiber through food sources.  Always check the fiber content on thenutrition facts label of any prepackaged food. Look for foods that contain at least 5 grams of fiber per serving.  Ask your dietitian if you have questions about specific foods that are related to your condition, especially if those foods are not listed in the following section.  Increase your daily fiber consumption gradually. Increasing your intake of  dietary fiber too quickly may cause bloating, cramping, or gas.  Drink plenty of water. Water helps you to digest fiber. WHAT FOODS CAN I EAT? Grains Whole-grain breads. Multigrain cereal. Oats and oatmeal. Brown rice. Barley. Bulgur wheat. [place]. Bran muffins. Popcorn. Rye wafer crackers. Vegetables Sweet potatoes. Spinach. Kale. Artichokes. Cabbage. Broccoli. Green peas. Carrots. Squash. Fruits Berries. Pears. Apples. Oranges. Avocados. Prunes and raisins. Dried figs. Meats and Other Protein Sources Navy, kidney, pinto, and soy beans. Split peas. Lentils. Nuts and seeds. Dairy Fiber-fortified yogurt. Beverages Fiber-fortified soy milk. Fiber-fortified orange juice. Other Fiber bars. The items listed above may not be a complete list of recommended foods or beverages. Contact your dietitian for more options. WHAT FOODS ARE NOT RECOMMENDED? Grains White bread. Pasta made with refined flour. White rice. Vegetables Fried potatoes. Canned vegetables. Well-cooked vegetables.  Fruits Fruit juice. Cooked, strained fruit. Meats and Other Protein Sources Fatty cuts of meat. Fried Sales executive or fried fish. Dairy Milk. Yogurt. Cream cheese. Sour cream. Beverages Soft drinks. Other Cakes and pastries. Butter and oils. The items listed above may not be a complete list of foods and beverages to avoid. Contact your dietitian for more information. WHAT ARE SOME TIPS FOR INCLUDING HIGH-FIBER FOODS IN MY DIET?  Eat a wide variety of high-fiber foods.  Make sure that half of all grains consumed each day are whole grains.  Replace breads and cereals made from refined flour or white flour with whole-grain breads and cereals.  Replace white rice with brown rice, bulgur wheat, or millet.  Start the day with  a breakfast that is high in fiber, such as a cereal that contains at least 5 grams of fiber per serving.  Use beans in place of meat in soups, salads, or pasta.  Eat high-fiber snacks,  such as berries, raw vegetables, nuts, or popcorn.   This information is not intended to replace advice given to you by your health care provider. Make sure you discuss any questions you have with your health care provider.   Document Released: 09/09/2005 Document Revised: 09/30/2014 Document Reviewed: 02/22/2014 Elsevier Interactive Patient Education 2016 [place] you for choosing me and [facility].  Pricilla Riffle. Dagoberto Ligas., MD., Marval Regal

## 2016-08-20 ENCOUNTER — Other Ambulatory Visit: Payer: Self-pay | Admitting: Gynecology

## 2016-08-20 DIAGNOSIS — Z1231 Encounter for screening mammogram for malignant neoplasm of breast: Secondary | ICD-10-CM

## 2016-09-17 ENCOUNTER — Encounter: Payer: Self-pay | Admitting: Hematology and Oncology

## 2016-09-24 ENCOUNTER — Ambulatory Visit
Admission: RE | Admit: 2016-09-24 | Discharge: 2016-09-24 | Disposition: A | Discharge status: Home / Self Care | Payer: 59 | Source: Ambulatory Visit | Attending: Gynecology | Admitting: Gynecology

## 2016-09-24 DIAGNOSIS — Z1231 Encounter for screening mammogram for malignant neoplasm of breast: Secondary | ICD-10-CM | POA: Diagnosis not present

## 2016-09-26 ENCOUNTER — Other Ambulatory Visit: Payer: Self-pay | Admitting: Gynecology

## 2016-09-26 DIAGNOSIS — R928 Other abnormal and inconclusive findings on diagnostic imaging of breast: Secondary | ICD-10-CM

## 2016-10-03 ENCOUNTER — Ambulatory Visit
Admission: RE | Admit: 2016-10-03 | Discharge: 2016-10-03 | Disposition: A | Discharge status: Home / Self Care | Payer: 59 | Source: Ambulatory Visit | Attending: Gynecology | Admitting: Gynecology

## 2016-10-03 DIAGNOSIS — N6311 Unspecified lump in the right breast, upper outer quadrant: Secondary | ICD-10-CM | POA: Diagnosis not present

## 2016-10-03 DIAGNOSIS — R928 Other abnormal and inconclusive findings on diagnostic imaging of breast: Secondary | ICD-10-CM

## 2016-10-04 DIAGNOSIS — Z0000 Encounter for general adult medical examination without abnormal findings: Secondary | ICD-10-CM | POA: Diagnosis not present

## 2016-10-04 DIAGNOSIS — I10 Essential (primary) hypertension: Secondary | ICD-10-CM | POA: Diagnosis not present

## 2016-10-14 DIAGNOSIS — Z1389 Encounter for screening for other disorder: Secondary | ICD-10-CM | POA: Diagnosis not present

## 2016-10-14 DIAGNOSIS — I10 Essential (primary) hypertension: Secondary | ICD-10-CM | POA: Diagnosis not present

## 2016-10-14 DIAGNOSIS — E784 Other hyperlipidemia: Secondary | ICD-10-CM | POA: Diagnosis not present

## 2016-10-14 DIAGNOSIS — Z0000 Encounter for general adult medical examination without abnormal findings: Secondary | ICD-10-CM | POA: Diagnosis not present

## 2016-10-14 DIAGNOSIS — E038 Other specified hypothyroidism: Secondary | ICD-10-CM | POA: Diagnosis not present

## 2016-10-16 DIAGNOSIS — Z01419 Encounter for gynecological examination (general) (routine) without abnormal findings: Secondary | ICD-10-CM | POA: Diagnosis not present

## 2016-10-16 DIAGNOSIS — Z780 Asymptomatic menopausal state: Secondary | ICD-10-CM | POA: Diagnosis not present

## 2016-10-16 DIAGNOSIS — Z124 Encounter for screening for malignant neoplasm of cervix: Secondary | ICD-10-CM | POA: Diagnosis not present

## 2016-10-30 DIAGNOSIS — J069 Acute upper respiratory infection, unspecified: Secondary | ICD-10-CM | POA: Diagnosis not present

## 2017-01-09 ENCOUNTER — Other Ambulatory Visit: Payer: 59

## 2017-01-09 ENCOUNTER — Ambulatory Visit: Payer: 59 | Admitting: Hematology and Oncology

## 2017-03-27 DIAGNOSIS — J018 Other acute sinusitis: Secondary | ICD-10-CM | POA: Diagnosis not present

## 2017-03-27 DIAGNOSIS — R05 Cough: Secondary | ICD-10-CM | POA: Diagnosis not present

## 2017-04-03 DIAGNOSIS — J0100 Acute maxillary sinusitis, unspecified: Secondary | ICD-10-CM | POA: Diagnosis not present

## 2017-04-03 DIAGNOSIS — R05 Cough: Secondary | ICD-10-CM | POA: Diagnosis not present

## 2017-04-16 ENCOUNTER — Other Ambulatory Visit: Payer: Self-pay | Admitting: Hematology and Oncology

## 2017-04-16 DIAGNOSIS — C9110 Chronic lymphocytic leukemia of B-cell type not having achieved remission: Secondary | ICD-10-CM

## 2017-04-17 ENCOUNTER — Ambulatory Visit (HOSPITAL_BASED_OUTPATIENT_CLINIC_OR_DEPARTMENT_OTHER): Payer: 59 | Admitting: Hematology and Oncology

## 2017-04-17 ENCOUNTER — Other Ambulatory Visit (HOSPITAL_BASED_OUTPATIENT_CLINIC_OR_DEPARTMENT_OTHER): Payer: 59

## 2017-04-17 ENCOUNTER — Encounter: Payer: Self-pay | Admitting: Hematology and Oncology

## 2017-04-17 VITALS — BP 123/67 | HR 88 | Temp 98.6°F | Resp 18 | Ht 65.0 in | Wt 158.6 lb

## 2017-04-17 DIAGNOSIS — C9110 Chronic lymphocytic leukemia of B-cell type not having achieved remission: Secondary | ICD-10-CM

## 2017-04-17 LAB — CBC WITH DIFFERENTIAL/PLATELET
BASO%: 0.2 % (ref 0.0–2.0)
Basophils Absolute: 0.1 10*3/uL (ref 0.0–0.1)
EOS%: 0.4 % (ref 0.0–7.0)
Eosinophils Absolute: 0.2 10*3/uL (ref 0.0–0.5)
HCT: 41.1 % (ref 34.8–46.6)
HEMOGLOBIN: 13.5 g/dL (ref 11.6–15.9)
LYMPH#: 47.5 10*3/uL — AB (ref 0.9–3.3)
LYMPH%: 87.9 % — ABNORMAL HIGH (ref 14.0–49.7)
MCH: 31.5 pg (ref 25.1–34.0)
MCHC: 32.8 g/dL (ref 31.5–36.0)
MCV: 96 fL (ref 79.5–101.0)
MONO#: 1.1 10*3/uL — AB (ref 0.1–0.9)
MONO%: 2.1 % (ref 0.0–14.0)
NEUT#: 5.1 10*3/uL (ref 1.5–6.5)
NEUT%: 9.4 % — ABNORMAL LOW (ref 38.4–76.8)
Platelets: 189 10*3/uL (ref 145–400)
RBC: 4.28 10*6/uL (ref 3.70–5.45)
RDW: 12.9 % (ref 11.2–14.5)
WBC: 54.1 10*3/uL (ref 3.9–10.3)
nRBC: 0 % (ref 0–0)

## 2017-04-17 LAB — TECHNOLOGIST REVIEW

## 2017-04-17 NOTE — Assessment & Plan Note (Signed)
The patient has significant leukocytosis compared to 15 months ago It is not clear whether it is related to recent infection I plan to see her back in 3 months for further review Clinically, she have no signs to suggest symptomatic progression of disease Surveillance at this point is to appropriate I reassured her

## 2017-04-17 NOTE — Progress Notes (Signed)
[facility] OFFICE PROGRESS NOTE  Patient Care Team: Harrison Mons, PA-C as PCP - General (Physician Assistant) West Pugh, NP (Inactive) as Nurse Practitioner (Gynecology) Heath Lark, MD as Consulting Physician (Hematology and Oncology) Velna Hatchet, MD as Consulting Physician (Internal Medicine)  SUMMARY OF ONCOLOGIC HISTORY: Oncology History   Rai Stage 0     CLL (chronic lymphocytic leukemia) ([place])   09/14/2013 Procedure    Flow cytometry confirmed diagnosis of CLL. The monoclonal kappa positive B cell stained positive for CD5, CD19, CD20 and CD23 and negative for lambda light chain, CD10 and FMC-7      09/15/2013 Initial Diagnosis    CLL (chronic lymphocytic leukemia)       INTERVAL HISTORY: Please see below for problem oriented charting. She was last seen here 50 months ago Recently, about 3 weeks ago, she developed upper respiratory tract infection requiring antibiotics She was also treated with a course of prednisone therapy Most of her symptoms had resolved She denies fever or chills No new lymphadenopathy, anorexia or weight loss  REVIEW OF SYSTEMS:   Constitutional: Denies fevers, chills or abnormal weight loss Eyes: Denies blurriness of vision Ears, nose, mouth, throat, and face: Denies mucositis or sore throat Respiratory: Denies cough, dyspnea or wheezes Cardiovascular: Denies palpitation, chest discomfort or lower extremity swelling Gastrointestinal:  Denies nausea, heartburn or change in bowel habits Skin: Denies abnormal skin rashes Lymphatics: Denies new lymphadenopathy or easy bruising Neurological:Denies numbness, tingling or new weaknesses Behavioral/Psych: Mood is stable, no new changes  All other systems were reviewed with the patient and are negative.  I have reviewed the past medical history, past surgical history, social history and family history with the patient and they are unchanged from previous  note.  ALLERGIES:  has No Known Allergies.  MEDICATIONS:  Current Outpatient Prescriptions  Medication Sig Dispense Refill  . ALPRAZolam (XANAX) 0.25 MG tablet Take 1 tablet (0.25 mg total) by mouth at bedtime as needed. 30 tablet 1  . aspirin 81 MG tablet Take 81 mg by mouth daily.    . cholecalciferol (VITAMIN D) 1000 UNITS tablet Take 1,000 Units by mouth daily.    Marland Kitchen levothyroxine (SYNTHROID, LEVOTHROID) 50 MCG tablet TAKE 1 TABLET BY MOUTH EVERY DAY. 90 tablet 1  . lisinopril (PRINIVIL,ZESTRIL) 5 MG tablet Take 1 tablet (5 mg total) by mouth daily. PATIENT NEEDS OFFICE VISIT FOR ADDITIONAL REFILLS 90 tablet 1  . MINIVELLE 0.075 MG/24HR Place 0.5 patches onto the skin 2 (two) times a week.  11  . mometasone (NASONEX) 50 MCG/ACT nasal spray Place 2 sprays into the nose daily. 17 g 12  . Progesterone Micronized (PROMETRIUM PO) Take 200 mg by mouth as directed. Daily for 12 days every 3 month.    . rosuvastatin (CRESTOR) 10 MG tablet Take 0.5 tablets (5 mg total) by mouth daily. 1/2 tab qd 90 tablet 1   No current facility-administered medications for this visit.     PHYSICAL EXAMINATION: ECOG PERFORMANCE STATUS: 0 - Asymptomatic  Vitals:   04/17/17 1003  BP: 123/67  Pulse: 88  Resp: 18  Temp: 98.6 F (37 C)   Filed Weights   04/17/17 1003  Weight: 158 lb 9.6 oz (71.9 kg)    GENERAL:alert, no distress and comfortable SKIN: skin color, texture, turgor are normal, no rashes or significant lesions EYES: normal, Conjunctiva are pink and non-injected, sclera clear OROPHARYNX:no exudate, no erythema and lips, buccal mucosa, and tongue normal  NECK: supple, thyroid normal size, non-tender,  without nodularity LYMPH: She has mild lymphadenopathy palpable on both sides of the neck LUNGS: clear to auscultation and percussion with normal breathing effort HEART: regular rate & rhythm and no murmurs and no lower extremity edema ABDOMEN:abdomen soft, non-tender and normal bowel  sounds Musculoskeletal:no cyanosis of digits and no clubbing  NEURO: alert & oriented x 3 with fluent speech, no focal motor/sensory deficits  LABORATORY DATA:  I have reviewed the data as listed    Component Value Date/Time   NA 140 01/11/2016 0957   K 4.5 01/11/2016 0957   CL 101 09/22/2014 1054   CO2 26 01/11/2016 0957   GLUCOSE 100 01/11/2016 0957   BUN 11.6 01/11/2016 0957   CREATININE 0.9 01/11/2016 0957   CALCIUM 9.5 01/11/2016 0957   PROT 7.0 01/11/2016 0957   ALBUMIN 4.0 01/11/2016 0957   AST 13 01/11/2016 0957   ALT 9 01/11/2016 0957   ALKPHOS 52 01/11/2016 0957   BILITOT 0.38 01/11/2016 0957    No results found for: SPEP, UPEP  Lab Results  Component Value Date   WBC 54.1 (HH) 04/17/2017   NEUTROABS 5.1 04/17/2017   HGB 13.5 04/17/2017   HCT 41.1 04/17/2017   MCV 96.0 04/17/2017   PLT 189 04/17/2017      Chemistry      Component Value Date/Time   NA 140 01/11/2016 0957   K 4.5 01/11/2016 0957   CL 101 09/22/2014 1054   CO2 26 01/11/2016 0957   BUN 11.6 01/11/2016 0957   CREATININE 0.9 01/11/2016 0957      Component Value Date/Time   CALCIUM 9.5 01/11/2016 0957   ALKPHOS 52 01/11/2016 0957   AST 13 01/11/2016 0957   ALT 9 01/11/2016 0957   BILITOT 0.38 01/11/2016 0957      ASSESSMENT & PLAN:  CLL (chronic lymphocytic leukemia) The patient has significant leukocytosis compared to 15 months ago It is not clear whether it is related to recent infection I plan to see her back in 3 months for further review Clinically, she have no signs to suggest symptomatic progression of disease Surveillance at this point is to appropriate I reassured her   Orders Placed This Encounter  Procedures  . Comprehensive metabolic panel    Standing Status:   Future    Standing Expiration Date:   05/22/2018  . CBC with Differential/Platelet    Standing Status:   Future    Standing Expiration Date:   05/22/2018  . Lactate dehydrogenase    Standing Status:    Future    Standing Expiration Date:   04/17/2018   All questions were answered. The patient knows to call the clinic with any problems, questions or concerns. No barriers to learning was detected. I spent 10 minutes counseling the patient face to face. The total time spent in the appointment was 15 minutes and more than 50% was on counseling and review of test results     Heath Lark, MD 04/17/2017 2:17 PM

## 2017-06-21 DIAGNOSIS — Z23 Encounter for immunization: Secondary | ICD-10-CM | POA: Diagnosis not present

## 2017-07-11 ENCOUNTER — Telehealth: Payer: Self-pay | Admitting: Hematology and Oncology

## 2017-07-11 ENCOUNTER — Encounter: Payer: Self-pay | Admitting: Hematology and Oncology

## 2017-07-11 ENCOUNTER — Ambulatory Visit (HOSPITAL_BASED_OUTPATIENT_CLINIC_OR_DEPARTMENT_OTHER): Payer: 59 | Admitting: Hematology and Oncology

## 2017-07-11 ENCOUNTER — Other Ambulatory Visit (HOSPITAL_BASED_OUTPATIENT_CLINIC_OR_DEPARTMENT_OTHER): Payer: 59

## 2017-07-11 DIAGNOSIS — C9110 Chronic lymphocytic leukemia of B-cell type not having achieved remission: Secondary | ICD-10-CM | POA: Diagnosis not present

## 2017-07-11 LAB — CBC WITH DIFFERENTIAL/PLATELET
BASO%: 0.1 % (ref 0.0–2.0)
BASOS ABS: 0 10*3/uL (ref 0.0–0.1)
EOS ABS: 0.1 10*3/uL (ref 0.0–0.5)
EOS%: 0.3 % (ref 0.0–7.0)
HEMATOCRIT: 39.6 % (ref 34.8–46.6)
HGB: 13.2 g/dL (ref 11.6–15.9)
LYMPH%: 88.4 % — AB (ref 14.0–49.7)
MCH: 31.9 pg (ref 25.1–34.0)
MCHC: 33.3 g/dL (ref 31.5–36.0)
MCV: 95.7 fL (ref 79.5–101.0)
MONO#: 0.6 10*3/uL (ref 0.1–0.9)
MONO%: 1.8 % (ref 0.0–14.0)
NEUT#: 3.1 10*3/uL (ref 1.5–6.5)
NEUT%: 9.4 % — AB (ref 38.4–76.8)
PLATELETS: 170 10*3/uL (ref 145–400)
RBC: 4.14 10*6/uL (ref 3.70–5.45)
RDW: 13 % (ref 11.2–14.5)
WBC: 33.1 10*3/uL — ABNORMAL HIGH (ref 3.9–10.3)
lymph#: 29.2 10*3/uL — ABNORMAL HIGH (ref 0.9–3.3)
nRBC: 0 % (ref 0–0)

## 2017-07-11 LAB — COMPREHENSIVE METABOLIC PANEL
ALT: 16 U/L (ref 0–55)
AST: 19 U/L (ref 5–34)
Albumin: 4.1 g/dL (ref 3.5–5.0)
Alkaline Phosphatase: 57 U/L (ref 40–150)
Anion Gap: 8 mEq/L (ref 3–11)
BUN: 12.2 mg/dL (ref 7.0–26.0)
CHLORIDE: 106 meq/L (ref 98–109)
CO2: 25 mEq/L (ref 22–29)
Calcium: 9.2 mg/dL (ref 8.4–10.4)
Creatinine: 0.9 mg/dL (ref 0.6–1.1)
EGFR: >60 mL/min/{1.73_m2} (ref >60)
GLUCOSE: 104 mg/dL (ref 70–140)
POTASSIUM: 3.8 meq/L (ref 3.5–5.1)
SODIUM: 139 meq/L (ref 136–145)
Total Bilirubin: 0.48 mg/dL (ref 0.20–1.20)
Total Protein: 6.8 g/dL (ref 6.4–8.3)

## 2017-07-11 LAB — TECHNOLOGIST REVIEW

## 2017-07-11 LAB — LACTATE DEHYDROGENASE: LDH: 142 U/L (ref 125–245)

## 2017-07-11 NOTE — Progress Notes (Signed)
[place] OFFICE PROGRESS NOTE  Patient Care Team: Harrison Mons, PA-C as PCP - General (Physician Assistant) West Pugh, NP (Inactive) as Nurse Practitioner (Gynecology) Heath Lark, MD as Consulting Physician (Hematology and Oncology) Velna Hatchet, MD as Consulting Physician (Internal Medicine)  SUMMARY OF ONCOLOGIC HISTORY: Oncology History   Rai Stage 0 FISH is within normal limits     CLL (chronic lymphocytic leukemia) ([place])   09/14/2013 Procedure    Flow cytometry confirmed diagnosis of CLL. The monoclonal kappa positive B cell stained positive for CD5, CD19, CD20 and CD23 and negative for lambda light chain, CD10 and FMC-7      09/15/2013 Initial Diagnosis    CLL (chronic lymphocytic leukemia)       INTERVAL HISTORY: Please see below for problem oriented charting. She returns for further follow-up She denies recent infection, fever or chills No new lymphadenopathy  REVIEW OF SYSTEMS:   Constitutional: Denies fevers, chills or abnormal weight loss Eyes: Denies blurriness of vision Ears, nose, mouth, throat, and face: Denies mucositis or sore throat Respiratory: Denies cough, dyspnea or wheezes Cardiovascular: Denies palpitation, chest discomfort or lower extremity swelling Gastrointestinal:  Denies nausea, heartburn or change in bowel habits Skin: Denies abnormal skin rashes Lymphatics: Denies new lymphadenopathy or easy bruising Neurological:Denies numbness, tingling or new weaknesses Behavioral/Psych: Mood is stable, no new changes  All other systems were reviewed with the patient and are negative.  I have reviewed the past medical history, past surgical history, social history and family history with the patient and they are unchanged from previous note.  ALLERGIES:  has No Known Allergies.  MEDICATIONS:  Current Outpatient Prescriptions  Medication Sig Dispense Refill  . ALPRAZolam (XANAX) 0.25 MG tablet Take 1 tablet (0.25 mg  total) by mouth at bedtime as needed. 30 tablet 1  . aspirin 81 MG tablet Take 81 mg by mouth daily.    . cholecalciferol (VITAMIN D) 1000 UNITS tablet Take 1,000 Units by mouth daily.    Marland Kitchen levothyroxine (SYNTHROID, LEVOTHROID) 50 MCG tablet TAKE 1 TABLET BY MOUTH EVERY DAY. 90 tablet 1  . lisinopril (PRINIVIL,ZESTRIL) 5 MG tablet Take 1 tablet (5 mg total) by mouth daily. PATIENT NEEDS OFFICE VISIT FOR ADDITIONAL REFILLS 90 tablet 1  . MINIVELLE 0.075 MG/24HR Place 0.5 patches onto the skin 2 (two) times a week.  11  . mometasone (NASONEX) 50 MCG/ACT nasal spray Place 2 sprays into the nose daily. 17 g 12  . Progesterone Micronized (PROMETRIUM PO) Take 200 mg by mouth as directed. Daily for 12 days every 3 month.    . rosuvastatin (CRESTOR) 10 MG tablet Take 0.5 tablets (5 mg total) by mouth daily. 1/2 tab qd 90 tablet 1   No current facility-administered medications for this visit.     PHYSICAL EXAMINATION: ECOG PERFORMANCE STATUS: 0 - Asymptomatic  Vitals:   07/11/17 0816  BP: 120/71  Pulse: 79  Resp: 17  Temp: 99.2 F (37.3 C)  SpO2: 100%   Filed Weights   07/11/17 0816  Weight: 158 lb 8 oz (71.9 kg)    GENERAL:alert, no distress and comfortable SKIN: skin color, texture, turgor are normal, no rashes or significant lesions EYES: normal, Conjunctiva are pink and non-injected, sclera clear OROPHARYNX:no exudate, no erythema and lips, buccal mucosa, and tongue normal  NECK: supple, thyroid normal size, non-tender, without nodularity LYMPH:  no palpable lymphadenopathy in the cervical, axillary or inguinal LUNGS: clear to auscultation and percussion with normal breathing effort HEART: regular rate &  rhythm and no murmurs and no lower extremity edema ABDOMEN:abdomen soft, non-tender and normal bowel sounds Musculoskeletal:no cyanosis of digits and no clubbing  NEURO: alert & oriented x 3 with fluent speech, no focal motor/sensory deficits  LABORATORY DATA:  I have reviewed  the data as listed    Component Value Date/Time   NA 140 01/11/2016 0957   K 4.5 01/11/2016 0957   CL 101 09/22/2014 1054   CO2 26 01/11/2016 0957   GLUCOSE 100 01/11/2016 0957   BUN 11.6 01/11/2016 0957   CREATININE 0.9 01/11/2016 0957   CALCIUM 9.5 01/11/2016 0957   PROT 7.0 01/11/2016 0957   ALBUMIN 4.0 01/11/2016 0957   AST 13 01/11/2016 0957   ALT 9 01/11/2016 0957   ALKPHOS 52 01/11/2016 0957   BILITOT 0.38 01/11/2016 0957    No results found for: SPEP, UPEP  Lab Results  Component Value Date   WBC 33.1 (H) 07/11/2017   NEUTROABS 3.1 07/11/2017   HGB 13.2 07/11/2017   HCT 39.6 07/11/2017   MCV 95.7 07/11/2017   PLT 170 07/11/2017      Chemistry      Component Value Date/Time   NA 140 01/11/2016 0957   K 4.5 01/11/2016 0957   CL 101 09/22/2014 1054   CO2 26 01/11/2016 0957   BUN 11.6 01/11/2016 0957   CREATININE 0.9 01/11/2016 0957      Component Value Date/Time   CALCIUM 9.5 01/11/2016 0957   ALKPHOS 52 01/11/2016 0957   AST 13 01/11/2016 0957   ALT 9 01/11/2016 0957   BILITOT 0.38 01/11/2016 0957     ASSESSMENT & PLAN:  CLL (chronic lymphocytic leukemia) Her white blood cell count and total lymphocyte count has improved since her last visit Overall, I felt that recent worsening leukocytosis could be reactive to infection which has subsequently resolved We discussed close surveillance with monitoring of signs and symptoms of disease progression such as unresolved infection, new lymphadenopathy or abnormal weight loss Plan to see her back again in 6 months with repeat history, physical examination and blood work.  She agree with the plan of care   No orders of the defined types were placed in this encounter.  All questions were answered. The patient knows to call the clinic with any problems, questions or concerns. No barriers to learning was detected. I spent 6 minutes counseling the patient face to face. The total time spent in the appointment was 10  minutes and more than 50% was on counseling and review of test results     Heath Lark, MD 07/11/2017 8:35 AM

## 2017-07-11 NOTE — Telephone Encounter (Signed)
Gave patient AVS and calender per los - Scheduled per 10/19 los.

## 2017-07-11 NOTE — Assessment & Plan Note (Signed)
Her white blood cell count and total lymphocyte count has improved since her last visit Overall, I felt that recent worsening leukocytosis could be reactive to infection which has subsequently resolved We discussed close surveillance with monitoring of signs and symptoms of disease progression such as unresolved infection, new lymphadenopathy or abnormal weight loss Plan to see her back again in 6 months with repeat history, physical examination and blood work.  She agree with the plan of care

## 2017-09-12 ENCOUNTER — Other Ambulatory Visit: Payer: Self-pay | Admitting: Gynecology

## 2017-09-12 DIAGNOSIS — Z1231 Encounter for screening mammogram for malignant neoplasm of breast: Secondary | ICD-10-CM

## 2017-10-07 DIAGNOSIS — R82998 Other abnormal findings in urine: Secondary | ICD-10-CM | POA: Diagnosis not present

## 2017-10-08 DIAGNOSIS — Z0000 Encounter for general adult medical examination without abnormal findings: Secondary | ICD-10-CM | POA: Diagnosis not present

## 2017-10-10 ENCOUNTER — Ambulatory Visit
Admission: RE | Admit: 2017-10-10 | Discharge: 2017-10-10 | Disposition: A | Discharge status: Home / Self Care | Payer: 59 | Source: Ambulatory Visit | Attending: Gynecology | Admitting: Gynecology

## 2017-10-10 DIAGNOSIS — Z1231 Encounter for screening mammogram for malignant neoplasm of breast: Secondary | ICD-10-CM

## 2017-10-15 DIAGNOSIS — J328 Other chronic sinusitis: Secondary | ICD-10-CM | POA: Diagnosis not present

## 2017-10-15 DIAGNOSIS — J019 Acute sinusitis, unspecified: Secondary | ICD-10-CM | POA: Diagnosis not present

## 2017-10-15 DIAGNOSIS — Z1389 Encounter for screening for other disorder: Secondary | ICD-10-CM | POA: Diagnosis not present

## 2017-10-15 DIAGNOSIS — E038 Other specified hypothyroidism: Secondary | ICD-10-CM | POA: Diagnosis not present

## 2017-10-15 DIAGNOSIS — Z0000 Encounter for general adult medical examination without abnormal findings: Secondary | ICD-10-CM | POA: Diagnosis not present

## 2017-10-20 ENCOUNTER — Encounter: Payer: Self-pay | Admitting: Hematology and Oncology

## 2017-11-20 DIAGNOSIS — Z780 Asymptomatic menopausal state: Secondary | ICD-10-CM | POA: Diagnosis not present

## 2017-11-20 DIAGNOSIS — Z01419 Encounter for gynecological examination (general) (routine) without abnormal findings: Secondary | ICD-10-CM | POA: Diagnosis not present

## 2017-12-29 ENCOUNTER — Encounter: Payer: Self-pay | Admitting: Physician Assistant

## 2017-12-30 ENCOUNTER — Telehealth: Payer: Self-pay | Admitting: Hematology and Oncology

## 2017-12-30 NOTE — Telephone Encounter (Signed)
Left message for patient regarding upcoming April appointment updates per provider request.

## 2018-01-08 ENCOUNTER — Other Ambulatory Visit: Payer: 59

## 2018-01-08 ENCOUNTER — Ambulatory Visit: Payer: 59 | Admitting: Hematology and Oncology

## 2018-01-29 ENCOUNTER — Inpatient Hospital Stay (HOSPITAL_BASED_OUTPATIENT_CLINIC_OR_DEPARTMENT_OTHER): Payer: 59 | Admitting: Hematology and Oncology

## 2018-01-29 ENCOUNTER — Inpatient Hospital Stay: Payer: 59 | Attending: Hematology and Oncology

## 2018-01-29 ENCOUNTER — Other Ambulatory Visit: Payer: Self-pay | Admitting: Hematology and Oncology

## 2018-01-29 ENCOUNTER — Encounter: Payer: Self-pay | Admitting: Hematology and Oncology

## 2018-01-29 DIAGNOSIS — C9110 Chronic lymphocytic leukemia of B-cell type not having achieved remission: Secondary | ICD-10-CM

## 2018-01-29 LAB — COMPREHENSIVE METABOLIC PANEL
ALT: 14 U/L (ref 0–55)
AST: 17 U/L (ref 5–34)
Albumin: 4.3 g/dL (ref 3.5–5.0)
Alkaline Phosphatase: 60 U/L (ref 40–150)
Anion gap: 5 (ref 3–11)
BILIRUBIN TOTAL: 0.4 mg/dL (ref 0.2–1.2)
BUN: 16 mg/dL (ref 7–26)
CALCIUM: 9.6 mg/dL (ref 8.4–10.4)
CO2: 28 mmol/L (ref 22–29)
Chloride: 103 mmol/L (ref 98–109)
Creatinine, Ser: 0.91 mg/dL (ref 0.60–1.10)
GFR calc Af Amer: >60 mL/min (ref >60)
Glucose, Bld: 92 mg/dL (ref 70–140)
POTASSIUM: 4 mmol/L (ref 3.5–5.1)
Sodium: 136 mmol/L (ref 136–145)
TOTAL PROTEIN: 7 g/dL (ref 6.4–8.3)

## 2018-01-29 LAB — CBC WITH DIFFERENTIAL/PLATELET
Basophils Absolute: 0.1 10*3/uL (ref 0.0–0.1)
Basophils Relative: 0 %
Eosinophils Absolute: 0.1 10*3/uL (ref 0.0–0.5)
Eosinophils Relative: 0 %
HEMATOCRIT: 38.5 % (ref 34.8–46.6)
Hemoglobin: 12.7 g/dL (ref 11.6–15.9)
LYMPHS ABS: 33.3 10*3/uL — AB (ref 0.9–3.3)
Lymphocytes Relative: 87 %
MCH: 31.6 pg (ref 25.1–34.0)
MCHC: 33 g/dL (ref 31.5–36.0)
MCV: 95.8 fL (ref 79.5–101.0)
MONO ABS: 0.7 10*3/uL (ref 0.1–0.9)
MONOS PCT: 2 %
NEUTROS ABS: 4.1 10*3/uL (ref 1.5–6.5)
Neutrophils Relative %: 11 %
Platelets: 174 10*3/uL (ref 145–400)
RBC: 4.02 MIL/uL (ref 3.70–5.45)
RDW: 13 % (ref 11.2–14.5)
WBC: 38.3 10*3/uL — ABNORMAL HIGH (ref 3.9–10.3)

## 2018-01-29 LAB — LACTATE DEHYDROGENASE: LDH: 164 U/L (ref 125–245)

## 2018-01-29 NOTE — Assessment & Plan Note (Signed)
Her white blood cell count and total lymphocyte count fluctuated up and down over the last few years Overall, her lymphocyte doubling  time is approximately 2 years Currently, she is not symptomatic. We discussed close surveillance with monitoring of signs and symptoms of disease progression such as unresolved infection, new lymphadenopathy or abnormal weight loss Plan to see her back again in 1 year with repeat history, physical examination and blood work.  She agree with the plan of care

## 2018-01-29 NOTE — Progress Notes (Signed)
[facility] OFFICE PROGRESS NOTE  Patient Care Team: Velna Hatchet, MD as PCP - General (Internal Medicine) Heath Lark, MD as Consulting Physician (Hematology and Oncology)  ASSESSMENT & PLAN:  CLL (chronic lymphocytic leukemia) Her white blood cell count and total lymphocyte count fluctuated up and down over the last few years Overall, her lymphocyte doubling  time is approximately 2 years Currently, she is not symptomatic. We discussed close surveillance with monitoring of signs and symptoms of disease progression such as unresolved infection, new lymphadenopathy or abnormal weight loss Plan to see her back again in 1 year with repeat history, physical examination and blood work.  She agree with the plan of care   No orders of the defined types were placed in this encounter.   INTERVAL HISTORY: Please see below for problem oriented charting. She returns for further follow-up She denies recent infection No anorexia, abnormal weight loss or abnormal night sweats Her appetite is stable without changes She denies new lymphadenopathy  SUMMARY OF ONCOLOGIC HISTORY: Oncology History   Rai Stage 0 FISH is within normal limits     CLL (chronic lymphocytic leukemia) ([place])   09/14/2013 Procedure    Flow cytometry confirmed diagnosis of CLL. The monoclonal kappa positive B cell stained positive for CD5, CD19, CD20 and CD23 and negative for lambda light chain, CD10 and FMC-7      09/15/2013 Initial Diagnosis    CLL (chronic lymphocytic leukemia)       REVIEW OF SYSTEMS:   Constitutional: Denies fevers, chills or abnormal weight loss Eyes: Denies blurriness of vision Ears, nose, mouth, throat, and face: Denies mucositis or sore throat Respiratory: Denies cough, dyspnea or wheezes Cardiovascular: Denies palpitation, chest discomfort or lower extremity swelling Gastrointestinal:  Denies nausea, heartburn or change in bowel habits Skin: Denies abnormal skin  rashes Lymphatics: Denies new lymphadenopathy or easy bruising Neurological:Denies numbness, tingling or new weaknesses Behavioral/Psych: Mood is stable, no new changes  All other systems were reviewed with the patient and are negative.  I have reviewed the past medical history, past surgical history, social history and family history with the patient and they are unchanged from previous note.  ALLERGIES:  has No Known Allergies.  MEDICATIONS:  Current Outpatient Medications  Medication Sig Dispense Refill  . ALPRAZolam (XANAX) 0.25 MG tablet Take 1 tablet (0.25 mg total) by mouth at bedtime as needed. 30 tablet 1  . aspirin 81 MG tablet Take 81 mg by mouth daily.    . cholecalciferol (VITAMIN D) 1000 UNITS tablet Take 1,000 Units by mouth daily.    Marland Kitchen levothyroxine (SYNTHROID, LEVOTHROID) 50 MCG tablet TAKE 1 TABLET BY MOUTH EVERY DAY. 90 tablet 1  . lisinopril (PRINIVIL,ZESTRIL) 5 MG tablet Take 1 tablet (5 mg total) by mouth daily. PATIENT NEEDS OFFICE VISIT FOR ADDITIONAL REFILLS 90 tablet 1  . MINIVELLE 0.075 MG/24HR Place 0.5 patches onto the skin 2 (two) times a week.  11  . mometasone (NASONEX) 50 MCG/ACT nasal spray Place 2 sprays into the nose daily. 17 g 12  . Progesterone Micronized (PROMETRIUM PO) Take 200 mg by mouth as directed. Daily for 12 days every 3 month.    . rosuvastatin (CRESTOR) 10 MG tablet Take 0.5 tablets (5 mg total) by mouth daily. 1/2 tab qd 90 tablet 1   No current facility-administered medications for this visit.     PHYSICAL EXAMINATION: ECOG PERFORMANCE STATUS: 0 - Asymptomatic  Vitals:   01/29/18 1236  BP: 139/72  Pulse: 67  Resp: 17  Temp: 98.1 F (36.7 C)  SpO2: 100%   Filed Weights   01/29/18 1236  Weight: 153 lb 6.4 oz (69.6 kg)    GENERAL:alert, no distress and comfortable SKIN: skin color, texture, turgor are normal, no rashes or significant lesions EYES: normal, Conjunctiva are pink and non-injected, sclera clear OROPHARYNX:no  exudate, no erythema and lips, buccal mucosa, and tongue normal  NECK: supple, thyroid normal size, non-tender, without nodularity LYMPH:  no palpable lymphadenopathy in the cervical, axillary or inguinal LUNGS: clear to auscultation and percussion with normal breathing effort HEART: regular rate & rhythm and no murmurs and no lower extremity edema ABDOMEN:abdomen soft, non-tender and normal bowel sounds Musculoskeletal:no cyanosis of digits and no clubbing  NEURO: alert & oriented x 3 with fluent speech, no focal motor/sensory deficits  LABORATORY DATA:  I have reviewed the data as listed    Component Value Date/Time   NA 136 01/29/2018 1228   NA 139 07/11/2017 0758   K 4.0 01/29/2018 1228   K 3.8 07/11/2017 0758   CL 103 01/29/2018 1228   CO2 28 01/29/2018 1228   CO2 25 07/11/2017 0758   GLUCOSE 92 01/29/2018 1228   GLUCOSE 104 07/11/2017 0758   BUN 16 01/29/2018 1228   BUN 12.2 07/11/2017 0758   CREATININE 0.91 01/29/2018 1228   CREATININE 0.9 07/11/2017 0758   CALCIUM 9.6 01/29/2018 1228   CALCIUM 9.2 07/11/2017 0758   PROT 7.0 01/29/2018 1228   PROT 6.8 07/11/2017 0758   ALBUMIN 4.3 01/29/2018 1228   ALBUMIN 4.1 07/11/2017 0758   AST 17 01/29/2018 1228   AST 19 07/11/2017 0758   ALT 14 01/29/2018 1228   ALT 16 07/11/2017 0758   ALKPHOS 60 01/29/2018 1228   ALKPHOS 57 07/11/2017 0758   BILITOT 0.4 01/29/2018 1228   BILITOT 0.48 07/11/2017 0758   GFRNONAA >60 01/29/2018 1228   GFRAA >60 01/29/2018 1228    No results found for: SPEP, UPEP  Lab Results  Component Value Date   WBC 38.3 (H) 01/29/2018   NEUTROABS 4.1 01/29/2018   HGB 12.7 01/29/2018   HCT 38.5 01/29/2018   MCV 95.8 01/29/2018   PLT 174 01/29/2018      Chemistry      Component Value Date/Time   NA 136 01/29/2018 1228   NA 139 07/11/2017 0758   K 4.0 01/29/2018 1228   K 3.8 07/11/2017 0758   CL 103 01/29/2018 1228   CO2 28 01/29/2018 1228   CO2 25 07/11/2017 0758   BUN 16 01/29/2018 1228    BUN 12.2 07/11/2017 0758   CREATININE 0.91 01/29/2018 1228   CREATININE 0.9 07/11/2017 0758      Component Value Date/Time   CALCIUM 9.6 01/29/2018 1228   CALCIUM 9.2 07/11/2017 0758   ALKPHOS 60 01/29/2018 1228   ALKPHOS 57 07/11/2017 0758   AST 17 01/29/2018 1228   AST 19 07/11/2017 0758   ALT 14 01/29/2018 1228   ALT 16 07/11/2017 0758   BILITOT 0.4 01/29/2018 1228   BILITOT 0.48 07/11/2017 0758      All questions were answered. The patient knows to call the clinic with any problems, questions or concerns. No barriers to learning was detected.  I spent 10 minutes counseling the patient face to face. The total time spent in the appointment was 15 minutes and more than 50% was on counseling and review of test results  Heath Lark, MD 01/29/2018 2:24 PM

## 2018-06-20 DIAGNOSIS — Z23 Encounter for immunization: Secondary | ICD-10-CM | POA: Diagnosis not present

## 2018-09-14 ENCOUNTER — Other Ambulatory Visit: Payer: Self-pay | Admitting: Internal Medicine

## 2018-09-14 ENCOUNTER — Other Ambulatory Visit: Payer: Self-pay | Admitting: Gynecology

## 2018-09-14 DIAGNOSIS — Z1231 Encounter for screening mammogram for malignant neoplasm of breast: Secondary | ICD-10-CM

## 2018-10-15 DIAGNOSIS — Z0000 Encounter for general adult medical examination without abnormal findings: Secondary | ICD-10-CM | POA: Diagnosis not present

## 2018-10-15 DIAGNOSIS — R82998 Other abnormal findings in urine: Secondary | ICD-10-CM | POA: Diagnosis not present

## 2018-10-21 ENCOUNTER — Ambulatory Visit
Admission: RE | Admit: 2018-10-21 | Discharge: 2018-10-21 | Disposition: A | Discharge status: Home / Self Care | Payer: 59 | Source: Ambulatory Visit | Attending: Internal Medicine | Admitting: Internal Medicine

## 2018-10-21 DIAGNOSIS — Z1231 Encounter for screening mammogram for malignant neoplasm of breast: Secondary | ICD-10-CM

## 2018-10-22 DIAGNOSIS — E7849 Other hyperlipidemia: Secondary | ICD-10-CM | POA: Diagnosis not present

## 2018-10-22 DIAGNOSIS — Z0000 Encounter for general adult medical examination without abnormal findings: Secondary | ICD-10-CM | POA: Diagnosis not present

## 2018-10-22 DIAGNOSIS — E038 Other specified hypothyroidism: Secondary | ICD-10-CM | POA: Diagnosis not present

## 2018-10-22 DIAGNOSIS — I10 Essential (primary) hypertension: Secondary | ICD-10-CM | POA: Diagnosis not present

## 2018-10-26 DIAGNOSIS — Z1212 Encounter for screening for malignant neoplasm of rectum: Secondary | ICD-10-CM | POA: Diagnosis not present

## 2018-11-27 DIAGNOSIS — Z79890 Hormone replacement therapy (postmenopausal): Secondary | ICD-10-CM | POA: Diagnosis not present

## 2018-11-27 DIAGNOSIS — Z01411 Encounter for gynecological examination (general) (routine) with abnormal findings: Secondary | ICD-10-CM | POA: Diagnosis not present

## 2018-11-27 DIAGNOSIS — Z780 Asymptomatic menopausal state: Secondary | ICD-10-CM | POA: Diagnosis not present

## 2019-01-25 ENCOUNTER — Other Ambulatory Visit: Payer: Self-pay | Admitting: Hematology and Oncology

## 2019-01-25 DIAGNOSIS — C9110 Chronic lymphocytic leukemia of B-cell type not having achieved remission: Secondary | ICD-10-CM

## 2019-01-29 ENCOUNTER — Telehealth: Payer: Self-pay | Admitting: Hematology and Oncology

## 2019-01-29 NOTE — Telephone Encounter (Signed)
Called patient per 5/08 sch message - unable to reach pt . Left message for patient to call back for reschedule.

## 2019-02-01 ENCOUNTER — Inpatient Hospital Stay: Payer: 59 | Admitting: Hematology and Oncology

## 2019-02-01 ENCOUNTER — Inpatient Hospital Stay: Payer: 59

## 2019-02-25 ENCOUNTER — Encounter: Payer: Self-pay | Admitting: Hematology and Oncology

## 2019-02-25 ENCOUNTER — Inpatient Hospital Stay: Payer: 59 | Attending: Hematology and Oncology

## 2019-02-25 ENCOUNTER — Inpatient Hospital Stay (HOSPITAL_BASED_OUTPATIENT_CLINIC_OR_DEPARTMENT_OTHER): Payer: 59 | Admitting: Hematology and Oncology

## 2019-02-25 ENCOUNTER — Other Ambulatory Visit: Payer: Self-pay

## 2019-02-25 DIAGNOSIS — C9110 Chronic lymphocytic leukemia of B-cell type not having achieved remission: Secondary | ICD-10-CM

## 2019-02-25 DIAGNOSIS — Z7982 Long term (current) use of aspirin: Secondary | ICD-10-CM

## 2019-02-25 DIAGNOSIS — Z79899 Other long term (current) drug therapy: Secondary | ICD-10-CM | POA: Diagnosis not present

## 2019-02-25 LAB — CBC WITH DIFFERENTIAL/PLATELET
Abs Immature Granulocytes: 0.04 10*3/uL (ref 0.00–0.07)
Basophils Absolute: 0 10*3/uL (ref 0.0–0.1)
Basophils Relative: 0 %
Eosinophils Absolute: 0.2 10*3/uL (ref 0.0–0.5)
Eosinophils Relative: 0 %
HCT: 39.4 % (ref 36.0–46.0)
Hemoglobin: 12.6 g/dL (ref 12.0–15.0)
Immature Granulocytes: 0 %
Lymphocytes Relative: 90 %
Lymphs Abs: 39.6 10*3/uL — ABNORMAL HIGH (ref 0.7–4.0)
MCH: 30.8 pg (ref 26.0–34.0)
MCHC: 32 g/dL (ref 30.0–36.0)
MCV: 96.3 fL (ref 80.0–100.0)
Monocytes Absolute: 0.7 10*3/uL (ref 0.1–1.0)
Monocytes Relative: 2 %
Neutro Abs: 3.3 10*3/uL (ref 1.7–7.7)
Neutrophils Relative %: 8 %
Platelets: 172 10*3/uL (ref 150–400)
RBC: 4.09 MIL/uL (ref 3.87–5.11)
RDW: 12.7 % (ref 11.5–15.5)
WBC: 43.9 10*3/uL — ABNORMAL HIGH (ref 4.0–10.5)
nRBC: 0 % (ref 0.0–0.2)

## 2019-02-25 NOTE — Progress Notes (Signed)
[facility] OFFICE PROGRESS NOTE  Patient Care Team: Velna Hatchet, MD as PCP - General (Internal Medicine) Heath Lark, MD as Consulting Physician (Hematology and Oncology)  ASSESSMENT & PLAN:  CLL (chronic lymphocytic leukemia) Her white blood cell count and total lymphocyte count fluctuated up and down over the last few years Overall, her lymphocyte doubling  time is approximately 4 years Currently, she is not symptomatic. We discussed close surveillance with monitoring of signs and symptoms of disease progression such as unresolved infection, new lymphadenopathy or abnormal weight loss Plan to see her back again in 1 year with repeat history, physical examination and blood work.  She agree with the plan of care We discussed the importance of annual influenza vaccination Due to risk of infection with the pandemic, I advised her to stay at home and work from home if possible   No orders of the defined types were placed in this encounter.   INTERVAL HISTORY: Please see below for problem oriented charting. She returns for further follow-up on CLL Denies recent infection, fever or chills No new lymphadenopathy Denies abnormal weight loss or night sweats  SUMMARY OF ONCOLOGIC HISTORY: Oncology History   Rai Stage 0 FISH is within normal limits     CLL (chronic lymphocytic leukemia) ([place])   09/14/2013 Procedure    Flow cytometry confirmed diagnosis of CLL. The monoclonal kappa positive B cell stained positive for CD5, CD19, CD20 and CD23 and negative for lambda light chain, CD10 and FMC-7    09/15/2013 Initial Diagnosis    CLL (chronic lymphocytic leukemia)     REVIEW OF SYSTEMS:   Constitutional: Denies fevers, chills or abnormal weight loss Eyes: Denies blurriness of vision Ears, nose, mouth, throat, and face: Denies mucositis or sore throat Respiratory: Denies cough, dyspnea or wheezes Cardiovascular: Denies palpitation, chest discomfort or lower  extremity swelling Gastrointestinal:  Denies nausea, heartburn or change in bowel habits Skin: Denies abnormal skin rashes Lymphatics: Denies new lymphadenopathy or easy bruising Neurological:Denies numbness, tingling or new weaknesses Behavioral/Psych: Mood is stable, no new changes  All other systems were reviewed with the patient and are negative.  I have reviewed the past medical history, past surgical history, social history and family history with the patient and they are unchanged from previous note.  ALLERGIES:  has No Known Allergies.  MEDICATIONS:  Current Outpatient Medications  Medication Sig Dispense Refill  . estradiol (VIVELLE-DOT) 0.05 MG/24HR patch Place 1 patch onto the skin once a week.    . progesterone (PROMETRIUM) 200 MG capsule Take 200 mg by mouth as directed. Every other month    . aspirin 81 MG tablet Take 81 mg by mouth daily.    . cholecalciferol (VITAMIN D) 1000 UNITS tablet Take 1,000 Units by mouth daily.    Marland Kitchen levothyroxine (SYNTHROID, LEVOTHROID) 50 MCG tablet TAKE 1 TABLET BY MOUTH EVERY DAY. 90 tablet 1  . lisinopril (PRINIVIL,ZESTRIL) 5 MG tablet Take 1 tablet (5 mg total) by mouth daily. PATIENT NEEDS OFFICE VISIT FOR ADDITIONAL REFILLS 90 tablet 1  . rosuvastatin (CRESTOR) 10 MG tablet Take 0.5 tablets (5 mg total) by mouth daily. 1/2 tab qd 90 tablet 1   No current facility-administered medications for this visit.     PHYSICAL EXAMINATION: ECOG PERFORMANCE STATUS: 0 - Asymptomatic  Vitals:   02/25/19 1016  BP: 133/77  Pulse: 78  Resp: 18  Temp: 98.2 F (36.8 C)  SpO2: 99%   Filed Weights   02/25/19 1016  Weight: 157 lb  6.4 oz (71.4 kg)    GENERAL:alert, no distress and comfortable SKIN: skin color, texture, turgor are normal, no rashes or significant lesions EYES: normal, Conjunctiva are pink and non-injected, sclera clear OROPHARYNX:no exudate, no erythema and lips, buccal mucosa, and tongue normal  NECK: supple, thyroid normal  size, non-tender, without nodularity LYMPH: She has palpable lymphadenopathy in the supraclavicular region bilaterally LUNGS: clear to auscultation and percussion with normal breathing effort HEART: regular rate & rhythm and no murmurs and no lower extremity edema ABDOMEN:abdomen soft, non-tender and normal bowel sounds Musculoskeletal:no cyanosis of digits and no clubbing  NEURO: alert & oriented x 3 with fluent speech, no focal motor/sensory deficits  LABORATORY DATA:  I have reviewed the data as listed    Component Value Date/Time   NA 136 01/29/2018 1228   NA 139 07/11/2017 0758   K 4.0 01/29/2018 1228   K 3.8 07/11/2017 0758   CL 103 01/29/2018 1228   CO2 28 01/29/2018 1228   CO2 25 07/11/2017 0758   GLUCOSE 92 01/29/2018 1228   GLUCOSE 104 07/11/2017 0758   BUN 16 01/29/2018 1228   BUN 12.2 07/11/2017 0758   CREATININE 0.91 01/29/2018 1228   CREATININE 0.9 07/11/2017 0758   CALCIUM 9.6 01/29/2018 1228   CALCIUM 9.2 07/11/2017 0758   PROT 7.0 01/29/2018 1228   PROT 6.8 07/11/2017 0758   ALBUMIN 4.3 01/29/2018 1228   ALBUMIN 4.1 07/11/2017 0758   AST 17 01/29/2018 1228   AST 19 07/11/2017 0758   ALT 14 01/29/2018 1228   ALT 16 07/11/2017 0758   ALKPHOS 60 01/29/2018 1228   ALKPHOS 57 07/11/2017 0758   BILITOT 0.4 01/29/2018 1228   BILITOT 0.48 07/11/2017 0758   GFRNONAA >60 01/29/2018 1228   GFRAA >60 01/29/2018 1228    No results found for: SPEP, UPEP  Lab Results  Component Value Date   WBC 43.9 (H) 02/25/2019   NEUTROABS 3.3 02/25/2019   HGB 12.6 02/25/2019   HCT 39.4 02/25/2019   MCV 96.3 02/25/2019   PLT 172 02/25/2019      Chemistry      Component Value Date/Time   NA 136 01/29/2018 1228   NA 139 07/11/2017 0758   K 4.0 01/29/2018 1228   K 3.8 07/11/2017 0758   CL 103 01/29/2018 1228   CO2 28 01/29/2018 1228   CO2 25 07/11/2017 0758   BUN 16 01/29/2018 1228   BUN 12.2 07/11/2017 0758   CREATININE 0.91 01/29/2018 1228   CREATININE 0.9  07/11/2017 0758      Component Value Date/Time   CALCIUM 9.6 01/29/2018 1228   CALCIUM 9.2 07/11/2017 0758   ALKPHOS 60 01/29/2018 1228   ALKPHOS 57 07/11/2017 0758   AST 17 01/29/2018 1228   AST 19 07/11/2017 0758   ALT 14 01/29/2018 1228   ALT 16 07/11/2017 0758   BILITOT 0.4 01/29/2018 1228   BILITOT 0.48 07/11/2017 0758      All questions were answered. The patient knows to call the clinic with any problems, questions or concerns. No barriers to learning was detected.  I spent 10 minutes counseling the patient face to face. The total time spent in the appointment was 15 minutes and more than 50% was on counseling and review of test results  Heath Lark, MD 02/25/2019 10:51 AM

## 2019-02-25 NOTE — Assessment & Plan Note (Signed)
Her white blood cell count and total lymphocyte count fluctuated up and down over the last few years Overall, her lymphocyte doubling  time is approximately 4 years Currently, she is not symptomatic. We discussed close surveillance with monitoring of signs and symptoms of disease progression such as unresolved infection, new lymphadenopathy or abnormal weight loss Plan to see her back again in 1 year with repeat history, physical examination and blood work.  She agree with the plan of care We discussed the importance of annual influenza vaccination Due to risk of infection with the pandemic, I advised her to stay at home and work from home if possible

## 2019-02-26 ENCOUNTER — Telehealth: Payer: Self-pay | Admitting: Hematology and Oncology

## 2019-02-26 NOTE — Telephone Encounter (Signed)
I left a message regarding schedule  

## 2019-02-28 IMAGING — MG 2D DIGITAL SCREENING BILATERAL MAMMOGRAM WITH 3D TOMO WITH CAD
9 of 12 series · 9 of 28 positions shown · non-contrast
Comparison: Previous exam(s).

CLINICAL DATA: Screening. History of chronic lymphocytic leukemia
diagnosed in 1521.

EXAM:
2D DIGITAL SCREENING BILATERAL MAMMOGRAM WITH 3D TOMO WITH CAD

[R CC]
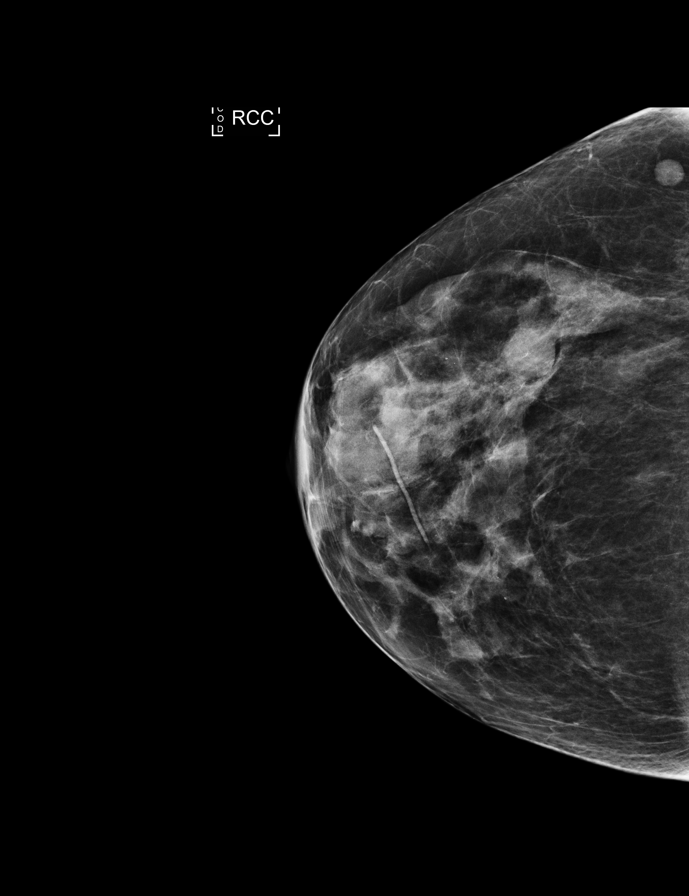

[L MLO synth-2D]
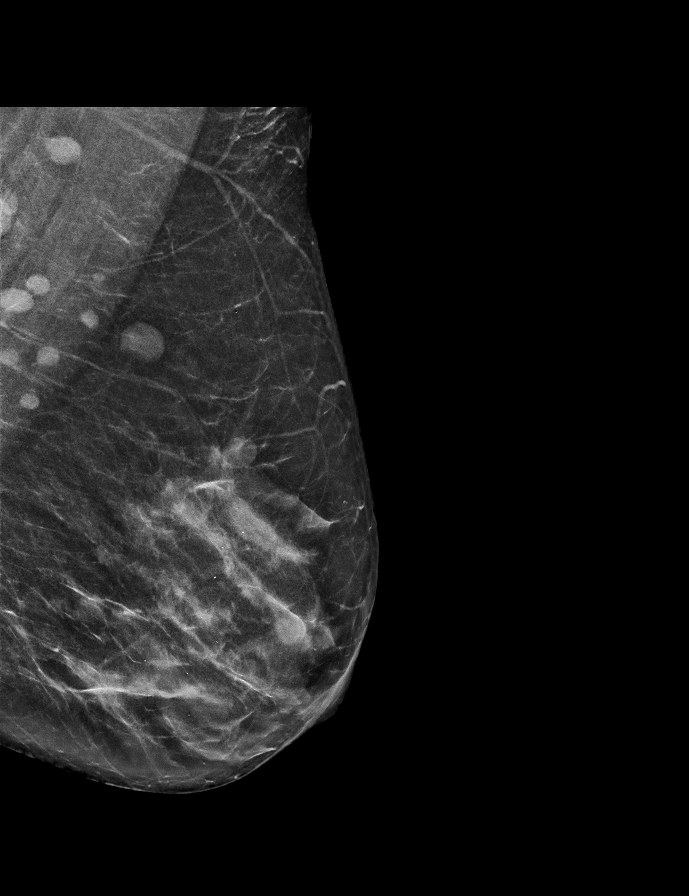

[L CC synth-2D]
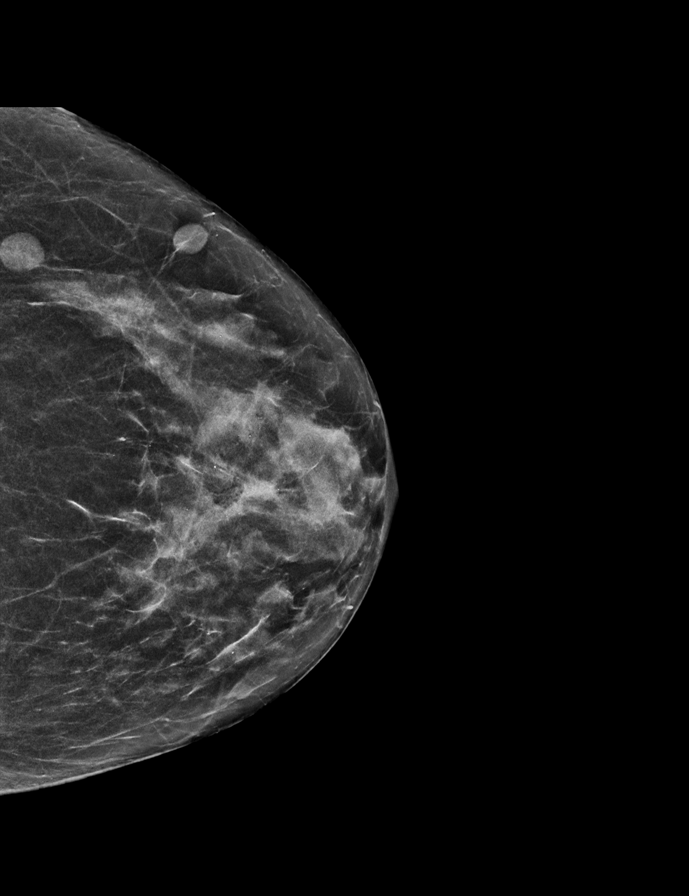

[L CC]
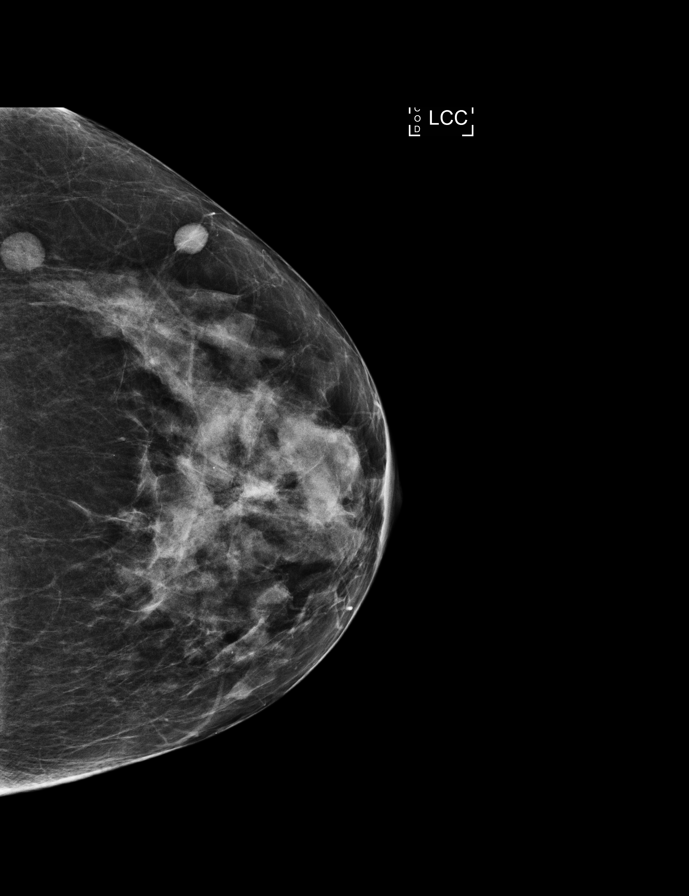

[R MLO synth-2D]
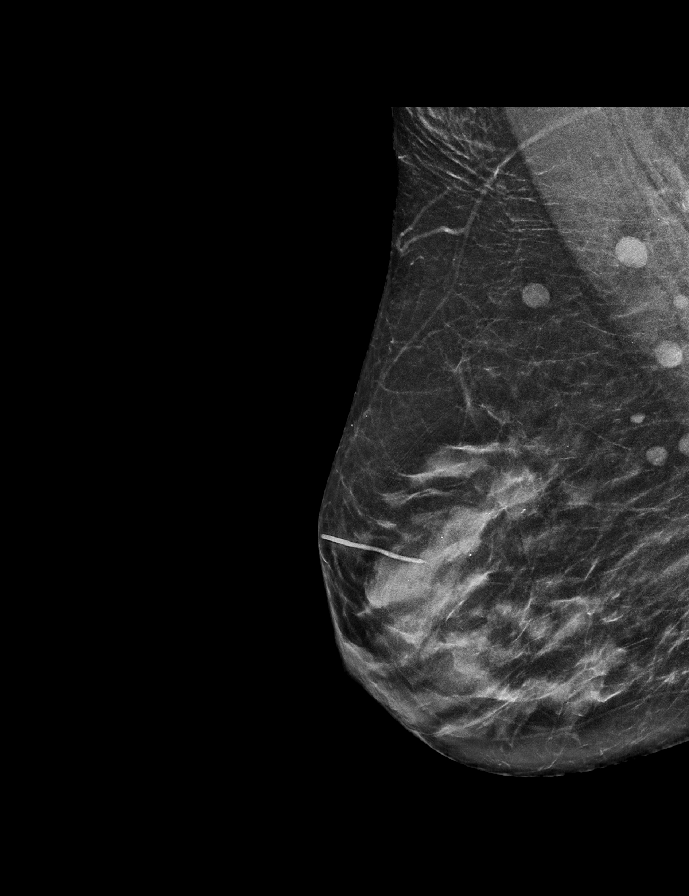

[L MLO]
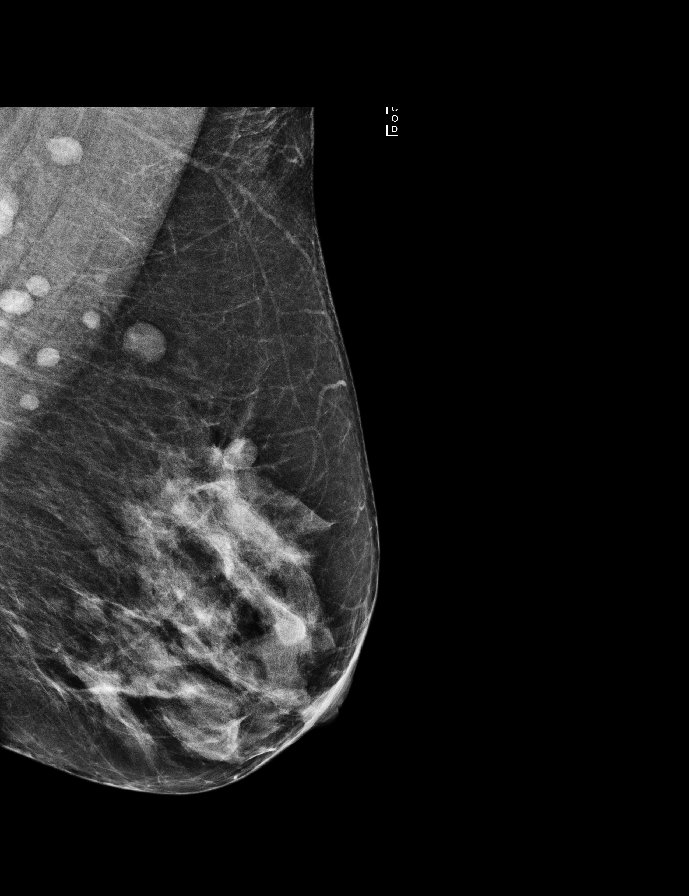

[R MLO]
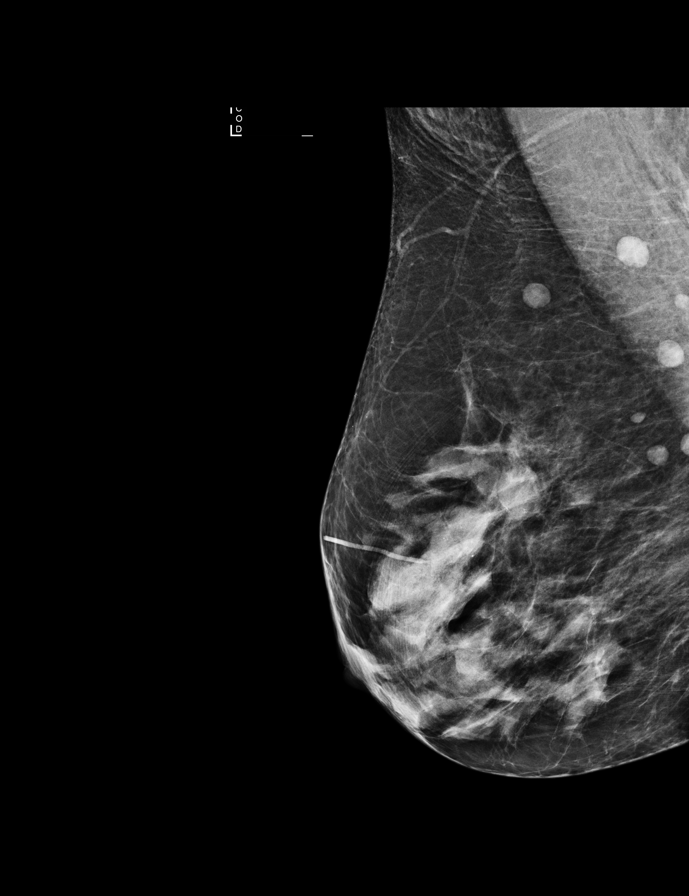

[R CC synth-2D]
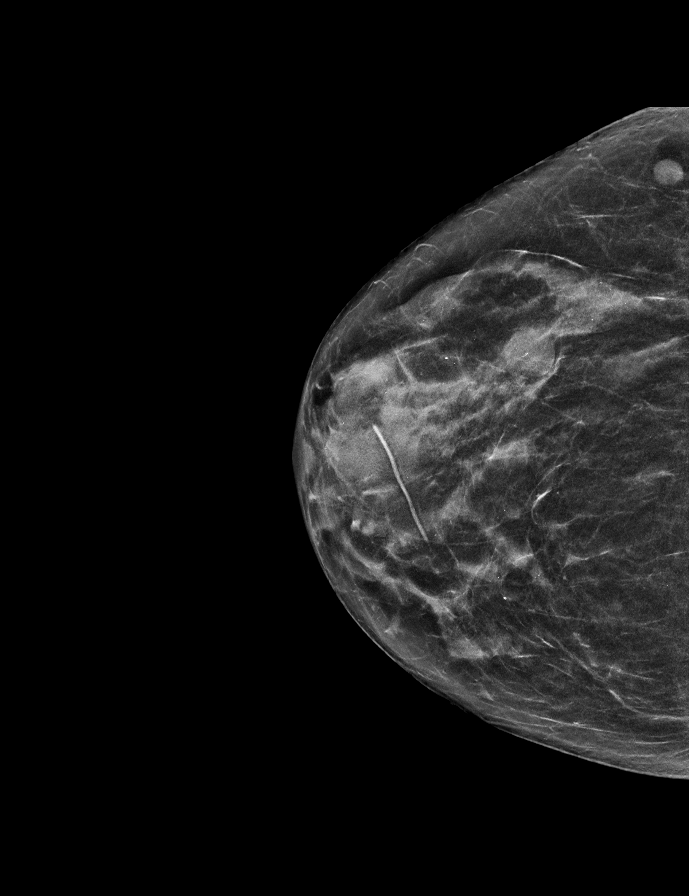

[R MLO tomo · tomo slice 28/55.0]
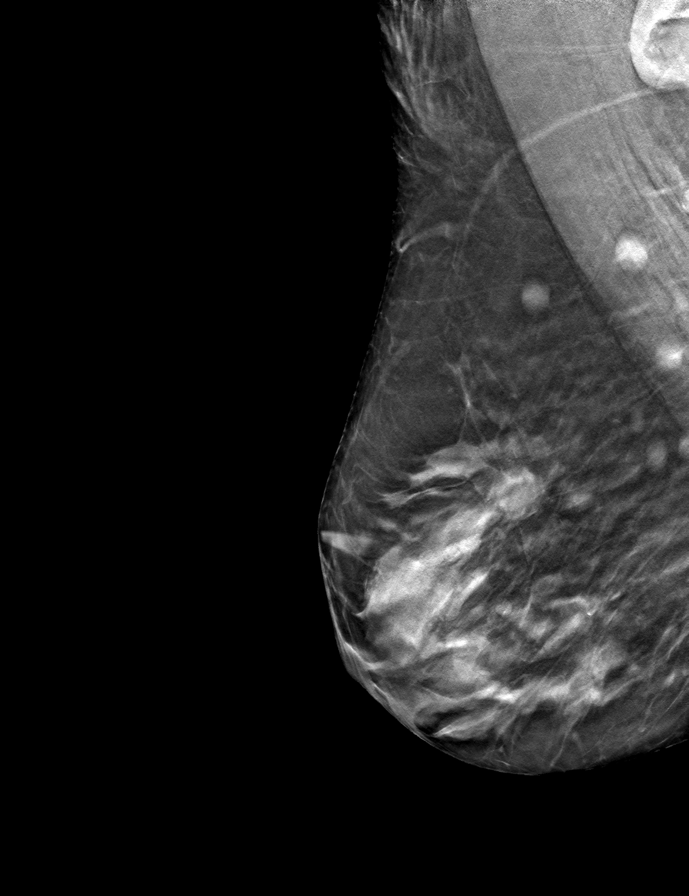

[9 of 28 positions shown; findings below may reference images not displayed]

ACR Breast Density Category c: The breast tissue is heterogeneously
dense, which may obscure small masses.
FINDINGS: There are no findings suspicious for malignancy. Images were
processed with CAD.
IMPRESSION: No mammographic evidence of malignancy. A result letter of this
screening mammogram will be mailed directly to the patient.

RECOMMENDATION:
Screening mammogram in one year. (Code:[id number]-7-DY0)

BI-RADS CATEGORY  1: Negative.

## 2019-12-10 ENCOUNTER — Ambulatory Visit: Payer: 59 | Attending: Internal Medicine

## 2019-12-10 DIAGNOSIS — Z23 Encounter for immunization: Secondary | ICD-10-CM

## 2019-12-10 NOTE — Progress Notes (Signed)
   Covid-19 Vaccination Clinic  Name:  Kelly [name]    MRN: [id number] DOB: [birth year]  12/10/2019  [name] was observed post Covid-19 immunization for 15 minutes without incident. She was provided with Vaccine Information Sheet and instruction to access the V-Safe system.   [name] was instructed to call 911 with any severe reactions post vaccine: Marland Kitchen Difficulty breathing  . Swelling of face and throat  . A fast heartbeat  . A bad rash all over body  . Dizziness and weakness   Immunizations Administered    Name Date Dose VIS Date Route   Pfizer COVID-19 Vaccine 12/10/2019  4:14 PM 0.3 mL 09/03/2019 Intramuscular   Manufacturer: [place]   Lot: [id number]   [place]: [id number]

## 2020-01-05 ENCOUNTER — Ambulatory Visit: Payer: 59 | Attending: Internal Medicine

## 2020-01-05 DIAGNOSIS — Z23 Encounter for immunization: Secondary | ICD-10-CM

## 2020-01-05 NOTE — Progress Notes (Signed)
   Covid-19 Vaccination Clinic  Name:  Kelly [name]    MRN: [id number] DOB: Nov 01, 1955  01/05/2020  [name] was observed post Covid-19 immunization for 15 minutes without incident. She was provided with Vaccine Information Sheet and instruction to access the V-Safe system.   [name] was instructed to call 911 with any severe reactions post vaccine: Marland Kitchen Difficulty breathing  . Swelling of face and throat  . A fast heartbeat  . A bad rash all over body  . Dizziness and weakness   Immunizations Administered    Name Date Dose VIS Date Route   Pfizer COVID-19 Vaccine 01/05/2020  3:58 PM 0.3 mL 09/03/2019 Intramuscular   Manufacturer: [place]   Lot: [id number]   [place]: [id number]

## 2020-02-24 ENCOUNTER — Other Ambulatory Visit: Payer: Self-pay | Admitting: Hematology and Oncology

## 2020-02-24 DIAGNOSIS — C9110 Chronic lymphocytic leukemia of B-cell type not having achieved remission: Secondary | ICD-10-CM

## 2020-02-25 ENCOUNTER — Telehealth: Payer: Self-pay | Admitting: Hematology and Oncology

## 2020-02-25 ENCOUNTER — Encounter: Payer: Self-pay | Admitting: Hematology and Oncology

## 2020-02-25 ENCOUNTER — Other Ambulatory Visit: Payer: Self-pay

## 2020-02-25 ENCOUNTER — Inpatient Hospital Stay: Payer: 59 | Attending: Hematology and Oncology | Admitting: Hematology and Oncology

## 2020-02-25 ENCOUNTER — Inpatient Hospital Stay: Payer: 59

## 2020-02-25 DIAGNOSIS — C9110 Chronic lymphocytic leukemia of B-cell type not having achieved remission: Secondary | ICD-10-CM | POA: Diagnosis not present

## 2020-02-25 DIAGNOSIS — Z7982 Long term (current) use of aspirin: Secondary | ICD-10-CM | POA: Insufficient documentation

## 2020-02-25 DIAGNOSIS — Z79899 Other long term (current) drug therapy: Secondary | ICD-10-CM | POA: Diagnosis not present

## 2020-02-25 LAB — CBC WITH DIFFERENTIAL/PLATELET
Abs Immature Granulocytes: 0.06 10*3/uL (ref 0.00–0.07)
Basophils Absolute: 0 10*3/uL (ref 0.0–0.1)
Basophils Relative: 0 %
Eosinophils Absolute: 0.2 10*3/uL (ref 0.0–0.5)
Eosinophils Relative: 0 %
HCT: 38.9 % (ref 36.0–46.0)
Hemoglobin: 12.4 g/dL (ref 12.0–15.0)
Immature Granulocytes: 0 %
Lymphocytes Relative: 92 %
Lymphs Abs: 45.3 10*3/uL — ABNORMAL HIGH (ref 0.7–4.0)
MCH: 30.2 pg (ref 26.0–34.0)
MCHC: 31.9 g/dL (ref 30.0–36.0)
MCV: 94.6 fL (ref 80.0–100.0)
Monocytes Absolute: 0.6 10*3/uL (ref 0.1–1.0)
Monocytes Relative: 1 %
Neutro Abs: 3.3 10*3/uL (ref 1.7–7.7)
Neutrophils Relative %: 7 %
Platelets: 176 10*3/uL (ref 150–400)
RBC: 4.11 MIL/uL (ref 3.87–5.11)
RDW: 12.8 % (ref 11.5–15.5)
WBC: 49.5 10*3/uL — ABNORMAL HIGH (ref 4.0–10.5)
nRBC: 0 % (ref 0.0–0.2)

## 2020-02-25 LAB — COMPREHENSIVE METABOLIC PANEL
ALT: 12 U/L (ref 0–44)
AST: 15 U/L (ref 15–41)
Albumin: 4 g/dL (ref 3.5–5.0)
Alkaline Phosphatase: 70 U/L (ref 38–126)
Anion gap: 8 (ref 5–15)
BUN: 16 mg/dL (ref 8–23)
CO2: 25 mmol/L (ref 22–32)
Calcium: 9.3 mg/dL (ref 8.9–10.3)
Chloride: 106 mmol/L (ref 98–111)
Creatinine, Ser: 0.95 mg/dL (ref 0.44–1.00)
GFR calc Af Amer: >60 mL/min (ref >60)
GFR calc non Af Amer: >60 mL/min (ref >60)
Glucose, Bld: 100 mg/dL — ABNORMAL HIGH (ref 70–99)
Potassium: 4.3 mmol/L (ref 3.5–5.1)
Sodium: 139 mmol/L (ref 135–145)
Total Bilirubin: 0.4 mg/dL (ref 0.3–1.2)
Total Protein: 6.9 g/dL (ref 6.5–8.1)

## 2020-02-25 LAB — LACTATE DEHYDROGENASE: LDH: 219 U/L — ABNORMAL HIGH (ref 98–192)

## 2020-02-25 NOTE — Telephone Encounter (Signed)
Scheduled per 6/4 sch message. Mailing pt calendar.

## 2020-02-25 NOTE — Progress Notes (Signed)
[facility] OFFICE PROGRESS NOTE  Patient Care Team: Velna Hatchet, MD as PCP - General (Internal Medicine) Heath Lark, MD as Consulting Physician (Hematology and Oncology)  ASSESSMENT & PLAN:  CLL (chronic lymphocytic leukemia) Her white blood cell count and total lymphocyte count fluctuated up and down over the last few years Overall, her lymphocyte doubling  time is approximately 4 years Currently, she is not symptomatic. We discussed close surveillance with monitoring of signs and symptoms of disease progression such as unresolved infection, new lymphadenopathy or abnormal weight loss Plan to see her back again in 1 year with repeat history, physical examination and blood work.  She agree with the plan of care   No orders of the defined types were placed in this encounter.   All questions were answered. The patient knows to call the clinic with any problems, questions or concerns. The total time spent in the appointment was 15 minutes encounter with patients including review of chart and various tests results, discussions about plan of care and coordination of care plan   Heath Lark, MD 02/25/2020 11:34 AM  INTERVAL HISTORY: Please see below for problem oriented charting. She returns for follow-up for CLL She have no new lymphadenopathy No recent infection, fever or chills Appetite is stable  SUMMARY OF ONCOLOGIC HISTORY: Oncology History Overview Note  Rai Stage 0 FISH is within normal limits   CLL (chronic lymphocytic leukemia) ([place])  09/14/2013 Procedure   Flow cytometry confirmed diagnosis of CLL. The monoclonal kappa positive B cell stained positive for CD5, CD19, CD20 and CD23 and negative for lambda light chain, CD10 and FMC-7   09/15/2013 Initial Diagnosis   CLL (chronic lymphocytic leukemia)     REVIEW OF SYSTEMS:   Constitutional: Denies fevers, chills or abnormal weight loss Eyes: Denies blurriness of vision Ears, nose, mouth, throat, and  face: Denies mucositis or sore throat Respiratory: Denies cough, dyspnea or wheezes Cardiovascular: Denies palpitation, chest discomfort or lower extremity swelling Gastrointestinal:  Denies nausea, heartburn or change in bowel habits Skin: Denies abnormal skin rashes Lymphatics: Denies new lymphadenopathy or easy bruising Neurological:Denies numbness, tingling or new weaknesses Behavioral/Psych: Mood is stable, no new changes  All other systems were reviewed with the patient and are negative.  I have reviewed the past medical history, past surgical history, social history and family history with the patient and they are unchanged from previous note.  ALLERGIES:  has No Known Allergies.  MEDICATIONS:  Current Outpatient Medications  Medication Sig Dispense Refill  . aspirin 81 MG tablet Take 81 mg by mouth daily.    . cholecalciferol (VITAMIN D) 1000 UNITS tablet Take 1,000 Units by mouth daily.    Marland Kitchen estradiol (VIVELLE-DOT) 0.05 MG/24HR patch Place 1 patch onto the skin once a week.    . levothyroxine (SYNTHROID, LEVOTHROID) 50 MCG tablet TAKE 1 TABLET BY MOUTH EVERY DAY. 90 tablet 1  . lisinopril (PRINIVIL,ZESTRIL) 5 MG tablet Take 1 tablet (5 mg total) by mouth daily. PATIENT NEEDS OFFICE VISIT FOR ADDITIONAL REFILLS 90 tablet 1  . progesterone (PROMETRIUM) 200 MG capsule Take 200 mg by mouth as directed. Every other month    . rosuvastatin (CRESTOR) 10 MG tablet Take 0.5 tablets (5 mg total) by mouth daily. 1/2 tab qd 90 tablet 1   No current facility-administered medications for this visit.    PHYSICAL EXAMINATION: ECOG PERFORMANCE STATUS: 0 - Asymptomatic  Vitals:   02/25/20 1001  BP: 132/83  Pulse: 83  Resp: 18  Temp:  98.2 F (36.8 C)  SpO2: 100%   Filed Weights   02/25/20 1001  Weight: 164 lb 3.2 oz (74.5 kg)    GENERAL:alert, no distress and comfortable SKIN: skin color, texture, turgor are normal, no rashes or significant lesions EYES: normal, Conjunctiva are  pink and non-injected, sclera clear OROPHARYNX:no exudate, no erythema and lips, buccal mucosa, and tongue normal  NECK: supple, thyroid normal size, non-tender, without nodularity LYMPH: She has palpable lymphadenopathy in the supraclavicular region LUNGS: clear to auscultation and percussion with normal breathing effort HEART: regular rate & rhythm and no murmurs and no lower extremity edema ABDOMEN:abdomen soft, non-tender and normal bowel sounds Musculoskeletal:no cyanosis of digits and no clubbing  NEURO: alert & oriented x 3 with fluent speech, no focal motor/sensory deficits  LABORATORY DATA:  I have reviewed the data as listed    Component Value Date/Time   NA 139 02/25/2020 0935   NA 139 07/11/2017 0758   K 4.3 02/25/2020 0935   K 3.8 07/11/2017 0758   CL 106 02/25/2020 0935   CO2 25 02/25/2020 0935   CO2 25 07/11/2017 0758   GLUCOSE 100 (H) 02/25/2020 0935   GLUCOSE 104 07/11/2017 0758   BUN 16 02/25/2020 0935   BUN 12.2 07/11/2017 0758   CREATININE 0.95 02/25/2020 0935   CREATININE 0.9 07/11/2017 0758   CALCIUM 9.3 02/25/2020 0935   CALCIUM 9.2 07/11/2017 0758   PROT 6.9 02/25/2020 0935   PROT 6.8 07/11/2017 0758   ALBUMIN 4.0 02/25/2020 0935   ALBUMIN 4.1 07/11/2017 0758   AST 15 02/25/2020 0935   AST 19 07/11/2017 0758   ALT 12 02/25/2020 0935   ALT 16 07/11/2017 0758   ALKPHOS 70 02/25/2020 0935   ALKPHOS 57 07/11/2017 0758   BILITOT 0.4 02/25/2020 0935   BILITOT 0.48 07/11/2017 0758   GFRNONAA >60 02/25/2020 0935   GFRAA >60 02/25/2020 0935    No results found for: SPEP, UPEP  Lab Results  Component Value Date   WBC 49.5 (H) 02/25/2020   NEUTROABS 3.3 02/25/2020   HGB 12.4 02/25/2020   HCT 38.9 02/25/2020   MCV 94.6 02/25/2020   PLT 176 02/25/2020      Chemistry      Component Value Date/Time   NA 139 02/25/2020 0935   NA 139 07/11/2017 0758   K 4.3 02/25/2020 0935   K 3.8 07/11/2017 0758   CL 106 02/25/2020 0935   CO2 25 02/25/2020 0935    CO2 25 07/11/2017 0758   BUN 16 02/25/2020 0935   BUN 12.2 07/11/2017 0758   CREATININE 0.95 02/25/2020 0935   CREATININE 0.9 07/11/2017 0758      Component Value Date/Time   CALCIUM 9.3 02/25/2020 0935   CALCIUM 9.2 07/11/2017 0758   ALKPHOS 70 02/25/2020 0935   ALKPHOS 57 07/11/2017 0758   AST 15 02/25/2020 0935   AST 19 07/11/2017 0758   ALT 12 02/25/2020 0935   ALT 16 07/11/2017 0758   BILITOT 0.4 02/25/2020 0935   BILITOT 0.48 07/11/2017 0758

## 2020-02-25 NOTE — Assessment & Plan Note (Signed)
Her white blood cell count and total lymphocyte count fluctuated up and down over the last few years Overall, her lymphocyte doubling time is approximately 4 years Currently, she is not symptomatic. We discussed close surveillance with monitoring of signs and symptoms of disease progression such as unresolved infection, new lymphadenopathy or abnormal weight loss Plan to see her back again in 1 year with repeat history, physical examination and blood work.  She agree with the plan of care 

## 2021-02-22 ENCOUNTER — Other Ambulatory Visit: Payer: Self-pay | Admitting: Hematology and Oncology

## 2021-02-22 DIAGNOSIS — C9110 Chronic lymphocytic leukemia of B-cell type not having achieved remission: Secondary | ICD-10-CM

## 2021-02-23 ENCOUNTER — Other Ambulatory Visit: Payer: Self-pay

## 2021-02-23 ENCOUNTER — Encounter: Payer: Self-pay | Admitting: Hematology and Oncology

## 2021-02-23 ENCOUNTER — Inpatient Hospital Stay: Payer: 59

## 2021-02-23 ENCOUNTER — Inpatient Hospital Stay: Payer: 59 | Attending: Hematology and Oncology | Admitting: Hematology and Oncology

## 2021-02-23 DIAGNOSIS — C9110 Chronic lymphocytic leukemia of B-cell type not having achieved remission: Secondary | ICD-10-CM

## 2021-02-23 DIAGNOSIS — Z299 Encounter for prophylactic measures, unspecified: Secondary | ICD-10-CM | POA: Diagnosis not present

## 2021-02-23 DIAGNOSIS — Z7982 Long term (current) use of aspirin: Secondary | ICD-10-CM | POA: Insufficient documentation

## 2021-02-23 DIAGNOSIS — D696 Thrombocytopenia, unspecified: Secondary | ICD-10-CM | POA: Insufficient documentation

## 2021-02-23 DIAGNOSIS — Z79899 Other long term (current) drug therapy: Secondary | ICD-10-CM | POA: Diagnosis not present

## 2021-02-23 LAB — CBC WITH DIFFERENTIAL/PLATELET
Abs Immature Granulocytes: 0.04 10*3/uL (ref 0.00–0.07)
Basophils Absolute: 0.1 10*3/uL (ref 0.0–0.1)
Basophils Relative: 0 %
Eosinophils Absolute: 0.2 10*3/uL (ref 0.0–0.5)
Eosinophils Relative: 0 %
HCT: 39.3 % (ref 36.0–46.0)
Hemoglobin: 12.4 g/dL (ref 12.0–15.0)
Immature Granulocytes: 0 %
Lymphocytes Relative: 91 %
Lymphs Abs: 38.2 10*3/uL — ABNORMAL HIGH (ref 0.7–4.0)
MCH: 30.2 pg (ref 26.0–34.0)
MCHC: 31.6 g/dL (ref 30.0–36.0)
MCV: 95.6 fL (ref 80.0–100.0)
Monocytes Absolute: 0.6 10*3/uL (ref 0.1–1.0)
Monocytes Relative: 1 %
Neutro Abs: 3.3 10*3/uL (ref 1.7–7.7)
Neutrophils Relative %: 8 %
Platelets: 143 10*3/uL — ABNORMAL LOW (ref 150–400)
RBC: 4.11 MIL/uL (ref 3.87–5.11)
RDW: 13.1 % (ref 11.5–15.5)
WBC: 42.3 10*3/uL — ABNORMAL HIGH (ref 4.0–10.5)
nRBC: 0 % (ref 0.0–0.2)

## 2021-02-23 NOTE — Assessment & Plan Note (Signed)
She has mild thrombocytopenia but not symptomatic Could be related to CLL or not Observe closely for now 

## 2021-02-23 NOTE — Progress Notes (Signed)
[facility] OFFICE PROGRESS NOTE  Patient Care Team: Velna Hatchet, MD as PCP - General (Internal Medicine) Heath Lark, MD as Consulting Physician (Hematology and Oncology)  ASSESSMENT & PLAN:  CLL (chronic lymphocytic leukemia) Her white blood cell count and total lymphocyte count fluctuated up and down over the last few years Overall, her lymphocyte doubling  time is approximately 4 years Currently, she is not symptomatic. We discussed close surveillance with monitoring of signs and symptoms of disease progression such as unresolved infection, new lymphadenopathy or abnormal weight loss Plan to see her back again in 1 year with repeat history, physical examination and blood work.  She agree with the plan of care  Thrombocytopenia ([place]) She has mild thrombocytopenia but not symptomatic Could be related to CLL or not Observe closely for now  Preventive measure We discussed the role of aggressive skin prevention due to high risk of skin cancer For now, I recommend she hold off her fourth injection for COVID-19 until the fall   No orders of the defined types were placed in this encounter.   All questions were answered. The patient knows to call the clinic with any problems, questions or concerns. The total time spent in the appointment was 20 minutes encounter with patients including review of chart and various tests results, discussions about plan of care and coordination of care plan   Heath Lark, MD 02/23/2021 10:12 AM  INTERVAL HISTORY: Please see below for problem oriented charting. She returns for CLL follow-up She is doing well No recent infection, fever or chills Denies abnormal skin lesions No new lymphadenopathy  SUMMARY OF ONCOLOGIC HISTORY: Oncology History Overview Note  Rai Stage 0 FISH is within normal limits   CLL (chronic lymphocytic leukemia) ([place])  09/14/2013 Procedure   Flow cytometry confirmed diagnosis of CLL. The monoclonal kappa  positive B cell stained positive for CD5, CD19, CD20 and CD23 and negative for lambda light chain, CD10 and FMC-7   09/15/2013 Initial Diagnosis   CLL (chronic lymphocytic leukemia)   02/23/2021 Cancer Staging   Staging form: Chronic Lymphocytic Leukemia / Small Lymphocytic Lymphoma, AJCC 8th Edition - Clinical stage from 02/23/2021: Modified Rai Stage 0 (Modified Rai risk: Low, Lymphocytosis: Present, Adenopathy: Absent, Organomegaly: Absent, Anemia: Absent, Thrombocytopenia: Absent) - Signed by Heath Lark, MD on 02/23/2021     REVIEW OF SYSTEMS:   Constitutional: Denies fevers, chills or abnormal weight loss Eyes: Denies blurriness of vision Ears, nose, mouth, throat, and face: Denies mucositis or sore throat Respiratory: Denies cough, dyspnea or wheezes Cardiovascular: Denies palpitation, chest discomfort or lower extremity swelling Gastrointestinal:  Denies nausea, heartburn or change in bowel habits Skin: Denies abnormal skin rashes Lymphatics: Denies new lymphadenopathy or easy bruising Neurological:Denies numbness, tingling or new weaknesses Behavioral/Psych: Mood is stable, no new changes  All other systems were reviewed with the patient and are negative.  I have reviewed the past medical history, past surgical history, social history and family history with the patient and they are unchanged from previous note.  ALLERGIES:  has No Known Allergies.  MEDICATIONS:  Current Outpatient Medications  Medication Sig Dispense Refill  . levocetirizine (XYZAL) 5 MG tablet Take 5 mg by mouth every evening.    . montelukast (SINGULAIR) 10 MG tablet Take 10 mg by mouth at bedtime.    Marland Kitchen aspirin 81 MG tablet Take 81 mg by mouth daily.    . cholecalciferol (VITAMIN D) 1000 UNITS tablet Take 1,000 Units by mouth daily.    Marland Kitchen  estradiol (VIVELLE-DOT) 0.05 MG/24HR patch Place 1 patch onto the skin once a week.    . levothyroxine (SYNTHROID, LEVOTHROID) 50 MCG tablet TAKE 1 TABLET BY MOUTH EVERY  DAY. 90 tablet 1  . lisinopril (PRINIVIL,ZESTRIL) 5 MG tablet Take 1 tablet (5 mg total) by mouth daily. PATIENT NEEDS OFFICE VISIT FOR ADDITIONAL REFILLS 90 tablet 1  . progesterone (PROMETRIUM) 200 MG capsule Take 200 mg by mouth as directed. Every other month    . rosuvastatin (CRESTOR) 10 MG tablet Take 0.5 tablets (5 mg total) by mouth daily. 1/2 tab qd 90 tablet 1   No current facility-administered medications for this visit.    PHYSICAL EXAMINATION: ECOG PERFORMANCE STATUS: 0 - Asymptomatic  Vitals:   02/23/21 0915  BP: 120/74  Pulse: 74  Resp: 18  Temp: 99.3 F (37.4 C)  SpO2: 100%   Filed Weights   02/23/21 0915  Weight: 158 lb 3.2 oz (71.8 kg)    GENERAL:alert, no distress and comfortable SKIN: skin color, texture, turgor are normal, no rashes or significant lesions EYES: normal, Conjunctiva are pink and non-injected, sclera clear OROPHARYNX:no exudate, no erythema and lips, buccal mucosa, and tongue normal  NECK: supple, thyroid normal size, non-tender, without nodularity LYMPH:  no palpable lymphadenopathy in the cervical, axillary or inguinal LUNGS: clear to auscultation and percussion with normal breathing effort HEART: regular rate & rhythm and no murmurs and no lower extremity edema ABDOMEN:abdomen soft, non-tender and normal bowel sounds Musculoskeletal:no cyanosis of digits and no clubbing  NEURO: alert & oriented x 3 with fluent speech, no focal motor/sensory deficits  LABORATORY DATA:  I have reviewed the data as listed    Component Value Date/Time   NA 139 02/25/2020 0935   NA 139 07/11/2017 0758   K 4.3 02/25/2020 0935   K 3.8 07/11/2017 0758   CL 106 02/25/2020 0935   CO2 25 02/25/2020 0935   CO2 25 07/11/2017 0758   GLUCOSE 100 (H) 02/25/2020 0935   GLUCOSE 104 07/11/2017 0758   BUN 16 02/25/2020 0935   BUN 12.2 07/11/2017 0758   CREATININE 0.95 02/25/2020 0935   CREATININE 0.9 07/11/2017 0758   CALCIUM 9.3 02/25/2020 0935   CALCIUM 9.2  07/11/2017 0758   PROT 6.9 02/25/2020 0935   PROT 6.8 07/11/2017 0758   ALBUMIN 4.0 02/25/2020 0935   ALBUMIN 4.1 07/11/2017 0758   AST 15 02/25/2020 0935   AST 19 07/11/2017 0758   ALT 12 02/25/2020 0935   ALT 16 07/11/2017 0758   ALKPHOS 70 02/25/2020 0935   ALKPHOS 57 07/11/2017 0758   BILITOT 0.4 02/25/2020 0935   BILITOT 0.48 07/11/2017 0758   GFRNONAA >60 02/25/2020 0935   GFRAA >60 02/25/2020 0935    No results found for: SPEP, UPEP  Lab Results  Component Value Date   WBC 42.3 (H) 02/23/2021   NEUTROABS 3.3 02/23/2021   HGB 12.4 02/23/2021   HCT 39.3 02/23/2021   MCV 95.6 02/23/2021   PLT 143 (L) 02/23/2021      Chemistry      Component Value Date/Time   NA 139 02/25/2020 0935   NA 139 07/11/2017 0758   K 4.3 02/25/2020 0935   K 3.8 07/11/2017 0758   CL 106 02/25/2020 0935   CO2 25 02/25/2020 0935   CO2 25 07/11/2017 0758   BUN 16 02/25/2020 0935   BUN 12.2 07/11/2017 0758   CREATININE 0.95 02/25/2020 0935   CREATININE 0.9 07/11/2017 0758      Component  Value Date/Time   CALCIUM 9.3 02/25/2020 0935   CALCIUM 9.2 07/11/2017 0758   ALKPHOS 70 02/25/2020 0935   ALKPHOS 57 07/11/2017 0758   AST 15 02/25/2020 0935   AST 19 07/11/2017 0758   ALT 12 02/25/2020 0935   ALT 16 07/11/2017 0758   BILITOT 0.4 02/25/2020 0935   BILITOT 0.48 07/11/2017 0758

## 2021-02-23 NOTE — Assessment & Plan Note (Signed)
We discussed the role of aggressive skin prevention due to high risk of skin cancer For now, I recommend she hold off her fourth injection for COVID-19 until the fall

## 2021-02-23 NOTE — Assessment & Plan Note (Signed)
Her white blood cell count and total lymphocyte count fluctuated up and down over the last few years Overall, her lymphocyte doubling time is approximately 4 years Currently, she is not symptomatic. We discussed close surveillance with monitoring of signs and symptoms of disease progression such as unresolved infection, new lymphadenopathy or abnormal weight loss Plan to see her back again in 1 year with repeat history, physical examination and blood work.  She agree with the plan of care 

## 2021-03-07 ENCOUNTER — Other Ambulatory Visit: Payer: Self-pay | Admitting: Gynecology

## 2021-03-07 DIAGNOSIS — R928 Other abnormal and inconclusive findings on diagnostic imaging of breast: Secondary | ICD-10-CM

## 2021-04-04 ENCOUNTER — Ambulatory Visit
Admission: RE | Admit: 2021-04-04 | Discharge: 2021-04-04 | Disposition: A | Discharge status: Home / Self Care | Payer: PPO | Source: Ambulatory Visit | Attending: Gynecology | Admitting: Gynecology

## 2021-04-04 ENCOUNTER — Ambulatory Visit: Payer: PPO

## 2021-04-04 ENCOUNTER — Other Ambulatory Visit: Payer: Self-pay

## 2021-04-04 DIAGNOSIS — R922 Inconclusive mammogram: Secondary | ICD-10-CM | POA: Diagnosis not present

## 2021-04-04 DIAGNOSIS — N6489 Other specified disorders of breast: Secondary | ICD-10-CM | POA: Diagnosis not present

## 2021-04-04 DIAGNOSIS — R928 Other abnormal and inconclusive findings on diagnostic imaging of breast: Secondary | ICD-10-CM

## 2021-06-19 DIAGNOSIS — H25813 Combined forms of age-related cataract, bilateral: Secondary | ICD-10-CM | POA: Diagnosis not present

## 2021-07-21 DIAGNOSIS — Z23 Encounter for immunization: Secondary | ICD-10-CM | POA: Diagnosis not present

## 2021-10-30 DIAGNOSIS — J329 Chronic sinusitis, unspecified: Secondary | ICD-10-CM | POA: Diagnosis not present

## 2021-10-30 DIAGNOSIS — C9100 Acute lymphoblastic leukemia not having achieved remission: Secondary | ICD-10-CM | POA: Diagnosis not present

## 2021-10-30 DIAGNOSIS — J208 Acute bronchitis due to other specified organisms: Secondary | ICD-10-CM | POA: Diagnosis not present

## 2021-10-30 DIAGNOSIS — Z1152 Encounter for screening for COVID-19: Secondary | ICD-10-CM | POA: Diagnosis not present

## 2021-10-30 DIAGNOSIS — R5383 Other fatigue: Secondary | ICD-10-CM | POA: Diagnosis not present

## 2021-10-30 DIAGNOSIS — R0981 Nasal congestion: Secondary | ICD-10-CM | POA: Diagnosis not present

## 2021-10-30 DIAGNOSIS — R051 Acute cough: Secondary | ICD-10-CM | POA: Diagnosis not present

## 2021-11-26 DIAGNOSIS — E039 Hypothyroidism, unspecified: Secondary | ICD-10-CM | POA: Diagnosis not present

## 2021-11-26 DIAGNOSIS — E785 Hyperlipidemia, unspecified: Secondary | ICD-10-CM | POA: Diagnosis not present

## 2021-11-26 DIAGNOSIS — Z0000 Encounter for general adult medical examination without abnormal findings: Secondary | ICD-10-CM | POA: Diagnosis not present

## 2021-11-26 DIAGNOSIS — I10 Essential (primary) hypertension: Secondary | ICD-10-CM | POA: Diagnosis not present

## 2021-11-26 DIAGNOSIS — C9110 Chronic lymphocytic leukemia of B-cell type not having achieved remission: Secondary | ICD-10-CM | POA: Diagnosis not present

## 2021-11-29 DIAGNOSIS — I10 Essential (primary) hypertension: Secondary | ICD-10-CM | POA: Diagnosis not present

## 2021-11-29 DIAGNOSIS — E039 Hypothyroidism, unspecified: Secondary | ICD-10-CM | POA: Diagnosis not present

## 2021-11-29 DIAGNOSIS — E785 Hyperlipidemia, unspecified: Secondary | ICD-10-CM | POA: Diagnosis not present

## 2021-12-24 DIAGNOSIS — I10 Essential (primary) hypertension: Secondary | ICD-10-CM | POA: Diagnosis not present

## 2021-12-24 DIAGNOSIS — Z125 Encounter for screening for malignant neoplasm of prostate: Secondary | ICD-10-CM | POA: Diagnosis not present

## 2021-12-24 DIAGNOSIS — R82998 Other abnormal findings in urine: Secondary | ICD-10-CM | POA: Diagnosis not present

## 2022-02-07 DIAGNOSIS — C9110 Chronic lymphocytic leukemia of B-cell type not having achieved remission: Secondary | ICD-10-CM | POA: Diagnosis not present

## 2022-02-07 DIAGNOSIS — R058 Other specified cough: Secondary | ICD-10-CM | POA: Diagnosis not present

## 2022-02-07 DIAGNOSIS — J45901 Unspecified asthma with (acute) exacerbation: Secondary | ICD-10-CM | POA: Diagnosis not present

## 2022-02-07 DIAGNOSIS — I10 Essential (primary) hypertension: Secondary | ICD-10-CM | POA: Diagnosis not present

## 2022-02-22 ENCOUNTER — Inpatient Hospital Stay: Payer: PPO | Admitting: Hematology and Oncology

## 2022-02-22 ENCOUNTER — Inpatient Hospital Stay: Payer: PPO

## 2022-03-08 ENCOUNTER — Inpatient Hospital Stay: Payer: PPO | Admitting: Hematology and Oncology

## 2022-03-08 ENCOUNTER — Inpatient Hospital Stay: Payer: PPO

## 2022-03-20 DIAGNOSIS — Z01419 Encounter for gynecological examination (general) (routine) without abnormal findings: Secondary | ICD-10-CM | POA: Diagnosis not present

## 2022-03-20 DIAGNOSIS — Z79890 Hormone replacement therapy (postmenopausal): Secondary | ICD-10-CM | POA: Diagnosis not present

## 2022-03-21 ENCOUNTER — Telehealth: Payer: Self-pay | Admitting: Hematology and Oncology

## 2022-03-21 ENCOUNTER — Telehealth: Payer: Self-pay

## 2022-03-21 ENCOUNTER — Inpatient Hospital Stay: Payer: PPO | Admitting: Hematology and Oncology

## 2022-03-21 ENCOUNTER — Inpatient Hospital Stay: Payer: PPO

## 2022-03-21 DIAGNOSIS — J0191 Acute recurrent sinusitis, unspecified: Secondary | ICD-10-CM | POA: Diagnosis not present

## 2022-03-21 NOTE — Telephone Encounter (Signed)
Returned her call and left a message. She is having cold symptoms and allergies. She is asking if she should cancel today's appt.  Per Dr. Alvy Bimler appts canceled for today and sent a scheduling message to reschedule 1 month out.

## 2022-03-21 NOTE — Telephone Encounter (Signed)
.  Called patient to schedule appointment per 6/29 inbasket, patient is aware of date and time.   

## 2022-03-27 DIAGNOSIS — Z1231 Encounter for screening mammogram for malignant neoplasm of breast: Secondary | ICD-10-CM | POA: Diagnosis not present

## 2022-03-29 ENCOUNTER — Other Ambulatory Visit: Payer: Self-pay | Admitting: Gynecology

## 2022-03-29 DIAGNOSIS — R928 Other abnormal and inconclusive findings on diagnostic imaging of breast: Secondary | ICD-10-CM

## 2022-04-10 DIAGNOSIS — J45901 Unspecified asthma with (acute) exacerbation: Secondary | ICD-10-CM | POA: Diagnosis not present

## 2022-04-10 DIAGNOSIS — J329 Chronic sinusitis, unspecified: Secondary | ICD-10-CM | POA: Diagnosis not present

## 2022-04-10 DIAGNOSIS — H6591 Unspecified nonsuppurative otitis media, right ear: Secondary | ICD-10-CM | POA: Diagnosis not present

## 2022-04-23 ENCOUNTER — Ambulatory Visit
Admission: RE | Admit: 2022-04-23 | Discharge: 2022-04-23 | Disposition: A | Discharge status: Home / Self Care | Payer: PPO | Source: Ambulatory Visit | Attending: Gynecology | Admitting: Gynecology

## 2022-04-23 ENCOUNTER — Ambulatory Visit: Admission: RE | Admit: 2022-04-23 | Payer: PPO | Source: Ambulatory Visit

## 2022-04-23 DIAGNOSIS — R928 Other abnormal and inconclusive findings on diagnostic imaging of breast: Secondary | ICD-10-CM

## 2022-04-23 DIAGNOSIS — R922 Inconclusive mammogram: Secondary | ICD-10-CM | POA: Diagnosis not present

## 2022-05-02 ENCOUNTER — Inpatient Hospital Stay (HOSPITAL_BASED_OUTPATIENT_CLINIC_OR_DEPARTMENT_OTHER): Payer: PPO | Admitting: Hematology and Oncology

## 2022-05-02 ENCOUNTER — Encounter: Payer: Self-pay | Admitting: Hematology and Oncology

## 2022-05-02 ENCOUNTER — Other Ambulatory Visit: Payer: Self-pay

## 2022-05-02 ENCOUNTER — Inpatient Hospital Stay: Payer: PPO | Attending: Hematology and Oncology

## 2022-05-02 DIAGNOSIS — Z79899 Other long term (current) drug therapy: Secondary | ICD-10-CM | POA: Diagnosis not present

## 2022-05-02 DIAGNOSIS — D696 Thrombocytopenia, unspecified: Secondary | ICD-10-CM | POA: Diagnosis not present

## 2022-05-02 DIAGNOSIS — Z299 Encounter for prophylactic measures, unspecified: Secondary | ICD-10-CM | POA: Diagnosis not present

## 2022-05-02 DIAGNOSIS — C9110 Chronic lymphocytic leukemia of B-cell type not having achieved remission: Secondary | ICD-10-CM | POA: Insufficient documentation

## 2022-05-02 LAB — CBC WITH DIFFERENTIAL/PLATELET
Abs Immature Granulocytes: 0.05 10*3/uL (ref 0.00–0.07)
Basophils Absolute: 0 10*3/uL (ref 0.0–0.1)
Basophils Relative: 0 %
Eosinophils Absolute: 0.2 10*3/uL (ref 0.0–0.5)
Eosinophils Relative: 0 %
HCT: 37 % (ref 36.0–46.0)
Hemoglobin: 12.2 g/dL (ref 12.0–15.0)
Immature Granulocytes: 0 %
Lymphocytes Relative: 90 %
Lymphs Abs: 40.4 10*3/uL — ABNORMAL HIGH (ref 0.7–4.0)
MCH: 31 pg (ref 26.0–34.0)
MCHC: 33 g/dL (ref 30.0–36.0)
MCV: 94.1 fL (ref 80.0–100.0)
Monocytes Absolute: 0.7 10*3/uL (ref 0.1–1.0)
Monocytes Relative: 2 %
Neutro Abs: 3.7 10*3/uL (ref 1.7–7.7)
Neutrophils Relative %: 8 %
Platelets: 149 10*3/uL — ABNORMAL LOW (ref 150–400)
RBC: 3.93 MIL/uL (ref 3.87–5.11)
RDW: 13.3 % (ref 11.5–15.5)
WBC: 45 10*3/uL — ABNORMAL HIGH (ref 4.0–10.5)
nRBC: 0 % (ref 0.0–0.2)

## 2022-05-02 NOTE — Progress Notes (Signed)
[facility] OFFICE PROGRESS NOTE  Patient Care Team: Velna Hatchet, MD as PCP - General (Internal Medicine) Heath Lark, MD as Consulting Physician (Hematology and Oncology)  ASSESSMENT & PLAN:  CLL (chronic lymphocytic leukemia) Her white blood cell count and total lymphocyte count fluctuated up and down over the last few years Overall, her lymphocyte doubling time is approximately 4 years Currently, she is not symptomatic. We discussed close surveillance with monitoring of signs and symptoms of disease progression such as unresolved infection, new lymphadenopathy or abnormal weight loss Plan to see her back again in 1 year with repeat history, physical examination and blood work.  She agree with the plan of care  Preventive measure I have reviewed her vaccination program She has completed and is up-to-date with COVID vaccination, annual influenza vaccination and pneumococcal vaccination We discussed the risk and benefits of vaccination against shingles  Thrombocytopenia ([place]) She has mild thrombocytopenia but not symptomatic Could be related to CLL or not Observe closely for now  No orders of the defined types were placed in this encounter.   All questions were answered. The patient knows to call the clinic with any problems, questions or concerns. The total time spent in the appointment was 20 minutes encounter with patients including review of chart and various tests results, discussions about plan of care and coordination of care plan   Heath Lark, MD 05/02/2022 10:52 AM  INTERVAL HISTORY: Please see below for problem oriented charting. she returns for surveillance follow-up for history of CLL She is not symptomatic Denies abnormal lymphadenopathy, weight loss or night sweats She is up-to-date with her screening programs  REVIEW OF SYSTEMS:   Constitutional: Denies fevers, chills or abnormal weight loss Eyes: Denies blurriness of vision Ears, nose,  mouth, throat, and face: Denies mucositis or sore throat Respiratory: Denies cough, dyspnea or wheezes Cardiovascular: Denies palpitation, chest discomfort or lower extremity swelling Gastrointestinal:  Denies nausea, heartburn or change in bowel habits Skin: Denies abnormal skin rashes Lymphatics: Denies new lymphadenopathy or easy bruising Neurological:Denies numbness, tingling or new weaknesses Behavioral/Psych: Mood is stable, no new changes  All other systems were reviewed with the patient and are negative.  I have reviewed the past medical history, past surgical history, social history and family history with the patient and they are unchanged from previous note.  ALLERGIES:  has No Known Allergies.  MEDICATIONS:  Current Outpatient Medications  Medication Sig Dispense Refill   aspirin 81 MG tablet Take 81 mg by mouth daily.     cholecalciferol (VITAMIN D) 1000 UNITS tablet Take 1,000 Units by mouth daily.     estradiol (VIVELLE-DOT) 0.05 MG/24HR patch Place 1 patch onto the skin once a week.     levocetirizine (XYZAL) 5 MG tablet Take 5 mg by mouth every evening.     levothyroxine (SYNTHROID, LEVOTHROID) 50 MCG tablet TAKE 1 TABLET BY MOUTH EVERY DAY. 90 tablet 1   lisinopril (PRINIVIL,ZESTRIL) 5 MG tablet Take 1 tablet (5 mg total) by mouth daily. PATIENT NEEDS OFFICE VISIT FOR ADDITIONAL REFILLS 90 tablet 1   montelukast (SINGULAIR) 10 MG tablet Take 10 mg by mouth at bedtime.     progesterone (PROMETRIUM) 200 MG capsule Take 200 mg by mouth as directed. Every other month     rosuvastatin (CRESTOR) 10 MG tablet Take 0.5 tablets (5 mg total) by mouth daily. 1/2 tab qd 90 tablet 1   No current facility-administered medications for this visit.    SUMMARY OF ONCOLOGIC HISTORY: Oncology  History Overview Note  Rai Stage 0 FISH is within normal limits   CLL (chronic lymphocytic leukemia) ([place])  09/14/2013 Procedure   Flow cytometry confirmed diagnosis of CLL. The monoclonal  kappa positive B cell stained positive for CD5, CD19, CD20 and CD23 and negative for lambda light chain, CD10 and FMC-7   09/15/2013 Initial Diagnosis   CLL (chronic lymphocytic leukemia)   02/23/2021 Cancer Staging   Staging form: Chronic Lymphocytic Leukemia / Small Lymphocytic Lymphoma, AJCC 8th Edition - Clinical stage from 02/23/2021: Modified Rai Stage 0 (Modified Rai risk: Low, Lymphocytosis: Present, Adenopathy: Absent, Organomegaly: Absent, Anemia: Absent, Thrombocytopenia: Absent) - Signed by Heath Lark, MD on 02/23/2021     PHYSICAL EXAMINATION: ECOG PERFORMANCE STATUS: 0 - Asymptomatic  Vitals:   05/02/22 1035  BP: 127/60  Pulse: 78  Resp: 18  Temp: 99.2 F (37.3 C)  SpO2: 100%   Filed Weights   05/02/22 1035  Weight: 160 lb 3.2 oz (72.7 kg)    GENERAL:alert, no distress and comfortable SKIN: skin color, texture, turgor are normal, no rashes or significant lesions EYES: normal, Conjunctiva are pink and non-injected, sclera clear OROPHARYNX:no exudate, no erythema and lips, buccal mucosa, and tongue normal  NECK: supple, thyroid normal size, non-tender, without nodularity LYMPH:  no palpable lymphadenopathy in the cervical, axillary or inguinal LUNGS: clear to auscultation and percussion with normal breathing effort HEART: regular rate & rhythm and no murmurs and no lower extremity edema ABDOMEN:abdomen soft, non-tender and normal bowel sounds Musculoskeletal:no cyanosis of digits and no clubbing  NEURO: alert & oriented x 3 with fluent speech, no focal motor/sensory deficits  LABORATORY DATA:  I have reviewed the data as listed    Component Value Date/Time   NA 139 02/25/2020 0935   NA 139 07/11/2017 0758   K 4.3 02/25/2020 0935   K 3.8 07/11/2017 0758   CL 106 02/25/2020 0935   CO2 25 02/25/2020 0935   CO2 25 07/11/2017 0758   GLUCOSE 100 (H) 02/25/2020 0935   GLUCOSE 104 07/11/2017 0758   BUN 16 02/25/2020 0935   BUN 12.2 07/11/2017 0758   CREATININE  0.95 02/25/2020 0935   CREATININE 0.9 07/11/2017 0758   CALCIUM 9.3 02/25/2020 0935   CALCIUM 9.2 07/11/2017 0758   PROT 6.9 02/25/2020 0935   PROT 6.8 07/11/2017 0758   ALBUMIN 4.0 02/25/2020 0935   ALBUMIN 4.1 07/11/2017 0758   AST 15 02/25/2020 0935   AST 19 07/11/2017 0758   ALT 12 02/25/2020 0935   ALT 16 07/11/2017 0758   ALKPHOS 70 02/25/2020 0935   ALKPHOS 57 07/11/2017 0758   BILITOT 0.4 02/25/2020 0935   BILITOT 0.48 07/11/2017 0758   GFRNONAA >60 02/25/2020 0935   GFRAA >60 02/25/2020 0935    No results found for: "SPEP", "UPEP"  Lab Results  Component Value Date   WBC 45.0 (H) 05/02/2022   NEUTROABS PENDING 05/02/2022   HGB 12.2 05/02/2022   HCT 37.0 05/02/2022   MCV 94.1 05/02/2022   PLT 149 (L) 05/02/2022      Chemistry      Component Value Date/Time   NA 139 02/25/2020 0935   NA 139 07/11/2017 0758   K 4.3 02/25/2020 0935   K 3.8 07/11/2017 0758   CL 106 02/25/2020 0935   CO2 25 02/25/2020 0935   CO2 25 07/11/2017 0758   BUN 16 02/25/2020 0935   BUN 12.2 07/11/2017 0758   CREATININE 0.95 02/25/2020 0935   CREATININE 0.9 07/11/2017 0758  Component Value Date/Time   CALCIUM 9.3 02/25/2020 0935   CALCIUM 9.2 07/11/2017 0758   ALKPHOS 70 02/25/2020 0935   ALKPHOS 57 07/11/2017 0758   AST 15 02/25/2020 0935   AST 19 07/11/2017 0758   ALT 12 02/25/2020 0935   ALT 16 07/11/2017 0758   BILITOT 0.4 02/25/2020 0935   BILITOT 0.48 07/11/2017 0758       RADIOGRAPHIC STUDIES: I have personally reviewed the radiological images as listed and agreed with the findings in the report. MM DIAG BREAST TOMO UNI RIGHT  Result Date: 04/23/2022 CLINICAL DATA:  Callback from screening mammogram for possible architectural distortion right breast. Patient has history of prior right breast surgery in the 1990s. EXAM: DIGITAL DIAGNOSTIC UNILATERAL RIGHT MAMMOGRAM WITH TOMOSYNTHESIS TECHNIQUE: Right digital diagnostic mammography and breast tomosynthesis was  performed. COMPARISON:  Previous exam(s). ACR Breast Density Category c: The breast tissue is heterogeneously dense, which may obscure small masses. FINDINGS: Spot compression cc and MLO views of the right breast are submitted. Previously questioned architectural distortion is slightly less prominent on additional views. The area of question is beneath the scar marker and is consistent with postsurgical scar unchanged compared to prior mammogram of 2020. IMPRESSION: Benign findings. RECOMMENDATION: Routine screening mammogram back on schedule. I have discussed the findings and recommendations with the patient. If applicable, a reminder letter will be sent to the patient regarding the next appointment. BI-RADS CATEGORY  2: Benign. Electronically Signed   By: Abelardo Diesel M.D.   On: 04/23/2022 09:59

## 2022-05-02 NOTE — Assessment & Plan Note (Signed)
Her white blood cell count and total lymphocyte count fluctuated up and down over the last few years Overall, her lymphocyte doubling time is approximately 4 years Currently, she is not symptomatic. We discussed close surveillance with monitoring of signs and symptoms of disease progression such as unresolved infection, new lymphadenopathy or abnormal weight loss Plan to see her back again in 1 year with repeat history, physical examination and blood work.  She agree with the plan of care

## 2022-05-02 NOTE — Assessment & Plan Note (Signed)
She has mild thrombocytopenia but not symptomatic Could be related to CLL or not Observe closely for now

## 2022-05-02 NOTE — Assessment & Plan Note (Signed)
I have reviewed her vaccination program She has completed and is up-to-date with COVID vaccination, annual influenza vaccination and pneumococcal vaccination We discussed the risk and benefits of vaccination against shingles

## 2022-05-09 DIAGNOSIS — J342 Deviated nasal septum: Secondary | ICD-10-CM | POA: Diagnosis not present

## 2022-05-09 DIAGNOSIS — J3089 Other allergic rhinitis: Secondary | ICD-10-CM | POA: Diagnosis not present

## 2022-05-09 DIAGNOSIS — H9011 Conductive hearing loss, unilateral, right ear, with unrestricted hearing on the contralateral side: Secondary | ICD-10-CM | POA: Diagnosis not present

## 2022-05-09 DIAGNOSIS — J0101 Acute recurrent maxillary sinusitis: Secondary | ICD-10-CM | POA: Diagnosis not present

## 2022-05-09 DIAGNOSIS — H6983 Other specified disorders of Eustachian tube, bilateral: Secondary | ICD-10-CM | POA: Diagnosis not present

## 2022-07-09 DIAGNOSIS — H9011 Conductive hearing loss, unilateral, right ear, with unrestricted hearing on the contralateral side: Secondary | ICD-10-CM | POA: Diagnosis not present

## 2022-07-09 DIAGNOSIS — H65491 Other chronic nonsuppurative otitis media, right ear: Secondary | ICD-10-CM | POA: Diagnosis not present

## 2022-07-13 DIAGNOSIS — Z23 Encounter for immunization: Secondary | ICD-10-CM | POA: Diagnosis not present

## 2022-11-12 DIAGNOSIS — H65491 Other chronic nonsuppurative otitis media, right ear: Secondary | ICD-10-CM | POA: Diagnosis not present

## 2022-11-12 DIAGNOSIS — H9011 Conductive hearing loss, unilateral, right ear, with unrestricted hearing on the contralateral side: Secondary | ICD-10-CM | POA: Diagnosis not present

## 2022-11-12 DIAGNOSIS — H6983 Other specified disorders of Eustachian tube, bilateral: Secondary | ICD-10-CM | POA: Diagnosis not present

## 2022-12-04 DIAGNOSIS — E785 Hyperlipidemia, unspecified: Secondary | ICD-10-CM | POA: Diagnosis not present

## 2022-12-04 DIAGNOSIS — I10 Essential (primary) hypertension: Secondary | ICD-10-CM | POA: Diagnosis not present

## 2022-12-04 DIAGNOSIS — R7989 Other specified abnormal findings of blood chemistry: Secondary | ICD-10-CM | POA: Diagnosis not present

## 2022-12-04 DIAGNOSIS — E039 Hypothyroidism, unspecified: Secondary | ICD-10-CM | POA: Diagnosis not present

## 2022-12-10 DIAGNOSIS — I10 Essential (primary) hypertension: Secondary | ICD-10-CM | POA: Diagnosis not present

## 2022-12-10 DIAGNOSIS — C9110 Chronic lymphocytic leukemia of B-cell type not having achieved remission: Secondary | ICD-10-CM | POA: Diagnosis not present

## 2022-12-10 DIAGNOSIS — E039 Hypothyroidism, unspecified: Secondary | ICD-10-CM | POA: Diagnosis not present

## 2022-12-10 DIAGNOSIS — Z1339 Encounter for screening examination for other mental health and behavioral disorders: Secondary | ICD-10-CM | POA: Diagnosis not present

## 2022-12-10 DIAGNOSIS — Z1331 Encounter for screening for depression: Secondary | ICD-10-CM | POA: Diagnosis not present

## 2022-12-10 DIAGNOSIS — Z23 Encounter for immunization: Secondary | ICD-10-CM | POA: Diagnosis not present

## 2022-12-10 DIAGNOSIS — J329 Chronic sinusitis, unspecified: Secondary | ICD-10-CM | POA: Diagnosis not present

## 2022-12-10 DIAGNOSIS — E785 Hyperlipidemia, unspecified: Secondary | ICD-10-CM | POA: Diagnosis not present

## 2022-12-10 DIAGNOSIS — Z0000 Encounter for general adult medical examination without abnormal findings: Secondary | ICD-10-CM | POA: Diagnosis not present

## 2023-02-10 ENCOUNTER — Encounter: Payer: Self-pay | Admitting: Hematology and Oncology

## 2023-02-18 DIAGNOSIS — R051 Acute cough: Secondary | ICD-10-CM | POA: Diagnosis not present

## 2023-02-18 DIAGNOSIS — C9110 Chronic lymphocytic leukemia of B-cell type not having achieved remission: Secondary | ICD-10-CM | POA: Diagnosis not present

## 2023-02-18 DIAGNOSIS — J069 Acute upper respiratory infection, unspecified: Secondary | ICD-10-CM | POA: Diagnosis not present

## 2023-02-18 DIAGNOSIS — I10 Essential (primary) hypertension: Secondary | ICD-10-CM | POA: Diagnosis not present

## 2023-03-10 ENCOUNTER — Other Ambulatory Visit: Payer: Self-pay | Admitting: Gynecology

## 2023-03-10 DIAGNOSIS — Z0000 Encounter for general adult medical examination without abnormal findings: Secondary | ICD-10-CM

## 2023-03-26 DIAGNOSIS — Z780 Asymptomatic menopausal state: Secondary | ICD-10-CM | POA: Diagnosis not present

## 2023-03-26 DIAGNOSIS — Z01419 Encounter for gynecological examination (general) (routine) without abnormal findings: Secondary | ICD-10-CM | POA: Diagnosis not present

## 2023-03-26 DIAGNOSIS — Z79890 Hormone replacement therapy (postmenopausal): Secondary | ICD-10-CM | POA: Diagnosis not present

## 2023-04-22 DIAGNOSIS — H524 Presbyopia: Secondary | ICD-10-CM | POA: Diagnosis not present

## 2023-04-22 DIAGNOSIS — H35061 Retinal vasculitis, right eye: Secondary | ICD-10-CM | POA: Diagnosis not present

## 2023-04-25 DIAGNOSIS — Z0100 Encounter for examination of eyes and vision without abnormal findings: Secondary | ICD-10-CM | POA: Diagnosis not present

## 2023-04-28 ENCOUNTER — Ambulatory Visit
Admission: RE | Admit: 2023-04-28 | Discharge: 2023-04-28 | Disposition: A | Discharge status: Home / Self Care | Payer: Medicare HMO | Source: Ambulatory Visit | Attending: Gynecology | Admitting: Gynecology

## 2023-04-28 DIAGNOSIS — Z0000 Encounter for general adult medical examination without abnormal findings: Secondary | ICD-10-CM

## 2023-04-28 DIAGNOSIS — Z1231 Encounter for screening mammogram for malignant neoplasm of breast: Secondary | ICD-10-CM | POA: Diagnosis not present

## 2023-05-06 ENCOUNTER — Telehealth: Payer: Self-pay | Admitting: Hematology and Oncology

## 2023-05-06 ENCOUNTER — Inpatient Hospital Stay: Payer: Medicare HMO | Attending: Hematology and Oncology

## 2023-05-06 ENCOUNTER — Encounter: Payer: Self-pay | Admitting: Hematology and Oncology

## 2023-05-06 ENCOUNTER — Inpatient Hospital Stay (HOSPITAL_BASED_OUTPATIENT_CLINIC_OR_DEPARTMENT_OTHER): Payer: Medicare HMO | Admitting: Hematology and Oncology

## 2023-05-06 VITALS — BP 127/62 | HR 75 | Temp 98.6°F | Resp 18 | Ht 65.0 in | Wt 163.4 lb

## 2023-05-06 DIAGNOSIS — C9110 Chronic lymphocytic leukemia of B-cell type not having achieved remission: Secondary | ICD-10-CM

## 2023-05-06 DIAGNOSIS — Z79899 Other long term (current) drug therapy: Secondary | ICD-10-CM | POA: Diagnosis not present

## 2023-05-06 DIAGNOSIS — R590 Localized enlarged lymph nodes: Secondary | ICD-10-CM | POA: Diagnosis not present

## 2023-05-06 DIAGNOSIS — Z7982 Long term (current) use of aspirin: Secondary | ICD-10-CM | POA: Insufficient documentation

## 2023-05-06 LAB — CMP (CANCER CENTER ONLY)
ALT: 9 U/L (ref 0–44)
AST: 14 U/L — ABNORMAL LOW (ref 15–41)
Albumin: 4.4 g/dL (ref 3.5–5.0)
Alkaline Phosphatase: 63 U/L (ref 38–126)
Anion gap: 4 — ABNORMAL LOW (ref 5–15)
BUN: 18 mg/dL (ref 8–23)
CO2: 29 mmol/L (ref 22–32)
Calcium: 9.1 mg/dL (ref 8.9–10.3)
Chloride: 104 mmol/L (ref 98–111)
Creatinine: 0.88 mg/dL (ref 0.44–1.00)
GFR, Estimated: >60 mL/min (ref >60)
Glucose, Bld: 102 mg/dL — ABNORMAL HIGH (ref 70–99)
Potassium: 4.5 mmol/L (ref 3.5–5.1)
Sodium: 137 mmol/L (ref 135–145)
Total Bilirubin: 0.4 mg/dL (ref 0.3–1.2)
Total Protein: 6.8 g/dL (ref 6.5–8.1)

## 2023-05-06 LAB — CBC WITH DIFFERENTIAL/PLATELET
Abs Immature Granulocytes: 0.05 10*3/uL (ref 0.00–0.07)
Basophils Absolute: 0 10*3/uL (ref 0.0–0.1)
Basophils Relative: 0 %
Eosinophils Absolute: 0.2 10*3/uL (ref 0.0–0.5)
Eosinophils Relative: 0 %
HCT: 39.1 % (ref 36.0–46.0)
Hemoglobin: 12.6 g/dL (ref 12.0–15.0)
Immature Granulocytes: 0 %
Lymphocytes Relative: 92 %
Lymphs Abs: 48.5 10*3/uL — ABNORMAL HIGH (ref 0.7–4.0)
MCH: 30.5 pg (ref 26.0–34.0)
MCHC: 32.2 g/dL (ref 30.0–36.0)
MCV: 94.7 fL (ref 80.0–100.0)
Monocytes Absolute: 0.6 10*3/uL (ref 0.1–1.0)
Monocytes Relative: 1 %
Neutro Abs: 3.7 10*3/uL (ref 1.7–7.7)
Neutrophils Relative %: 7 %
Platelets: 159 10*3/uL (ref 150–400)
RBC: 4.13 MIL/uL (ref 3.87–5.11)
RDW: 13.1 % (ref 11.5–15.5)
Smear Review: NORMAL
WBC: 53 10*3/uL (ref 4.0–10.5)
nRBC: 0 % (ref 0.0–0.2)

## 2023-05-06 LAB — LACTATE DEHYDROGENASE: LDH: 129 U/L (ref 98–192)

## 2023-05-06 NOTE — Telephone Encounter (Signed)
CRITICAL VALUE STICKER  CRITICAL VALUE: WBC 53  RECEIVER (on-site recipient of call):    DATE & TIME NOTIFIED: 1001  MESSENGER (representative from lab): Shanda Bumps  MD NOTIFIED: Bertis Ruddy  TIME OF NOTIFICATION: 1003  RESPONSE: Noted

## 2023-05-06 NOTE — Progress Notes (Signed)
[facility] OFFICE PROGRESS NOTE  Patient Care Team: Alysia Penna, MD as PCP - General (Internal Medicine) Artis Delay, MD as Consulting Physician (Hematology and Oncology)  ASSESSMENT & PLAN:  CLL (chronic lymphocytic leukemia) She has slight progression of her lymphocytosis She also have palpable lymphadenopathy Recent bilateral lymphadenopathy seen on mammogram could be related to CLL but other type of malignancies cannot be excluded We discussed the risk and benefits of ordering additional workup I will order CMP and LDH as well as CT imaging of the chest, abdomen and pelvis for staging I will hold off repeating prognostic markers for CLL until we review test results There is a possibility we may not need to treat her yet  Lymphadenopathy, axillary She has probable axillary lymphadenopathy and that was also seen on recent mammogram It could be related to CLL or breast cancer I will order CT imaging first for assessment and she agreed  Orders Placed This Encounter  Procedures   CT CHEST ABDOMEN PELVIS W CONTRAST    No need oral contrast    Standing Status:   Future    Standing Expiration Date:   05/05/2024    Scheduling Instructions:     No need oral contrast    Order Specific Question:   If indicated for the ordered procedure, I authorize the administration of contrast media per Radiology protocol    Answer:   Yes    Order Specific Question:   Does the patient have a contrast media/X-ray dye allergy?    Answer:   No    Order Specific Question:   Preferred imaging location?    Answer:   [facility]    Order Specific Question:   If indicated for the ordered procedure, I authorize the administration of oral contrast media per Radiology protocol    Answer:   Yes   CMP (Cancer Center only)    Standing Status:   Future    Number of Occurrences:   1    Standing Expiration Date:   05/05/2024   Lactate dehydrogenase    Standing Status:   Future    Number  of Occurrences:   1    Standing Expiration Date:   05/05/2024    All questions were answered. The patient knows to call the clinic with any problems, questions or concerns. The total time spent in the appointment was 30 minutes encounter with patients including review of chart and various tests results, discussions about plan of care and coordination of care plan   Artis Delay, MD 05/06/2023 10:23 AM  INTERVAL HISTORY: Please see below for problem oriented charting. she returns for surveillance follow-up for CLL She had recent mammogram which showed lymphadenopathy in the axilla The patient herself did not feel any abnormal lymphadenopathy She denies recent infection No abnormal weight loss or night sweats  REVIEW OF SYSTEMS:   Constitutional: Denies fevers, chills or abnormal weight loss Eyes: Denies blurriness of vision Ears, nose, mouth, throat, and face: Denies mucositis or sore throat Respiratory: Denies cough, dyspnea or wheezes Cardiovascular: Denies palpitation, chest discomfort or lower extremity swelling Gastrointestinal:  Denies nausea, heartburn or change in bowel habits Skin: Denies abnormal skin rashes Neurological:Denies numbness, tingling or new weaknesses Behavioral/Psych: Mood is stable, no new changes  All other systems were reviewed with the patient and are negative.  I have reviewed the past medical history, past surgical history, social history and family history with the patient and they are unchanged from previous note.  ALLERGIES:  has No Known Allergies.  MEDICATIONS:  Current Outpatient Medications  Medication Sig Dispense Refill   aspirin 81 MG tablet Take 81 mg by mouth daily.     cholecalciferol (VITAMIN D) 1000 UNITS tablet Take 1,000 Units by mouth daily.     estradiol (VIVELLE-DOT) 0.05 MG/24HR patch Place 1 patch onto the skin once a week.     levocetirizine (XYZAL) 5 MG tablet Take 5 mg by mouth every evening.     levothyroxine (SYNTHROID,  LEVOTHROID) 50 MCG tablet TAKE 1 TABLET BY MOUTH EVERY DAY. 90 tablet 1   lisinopril (PRINIVIL,ZESTRIL) 5 MG tablet Take 1 tablet (5 mg total) by mouth daily. PATIENT NEEDS OFFICE VISIT FOR ADDITIONAL REFILLS 90 tablet 1   montelukast (SINGULAIR) 10 MG tablet Take 10 mg by mouth at bedtime.     progesterone (PROMETRIUM) 200 MG capsule Take 200 mg by mouth as directed. Every other month     rosuvastatin (CRESTOR) 10 MG tablet Take 0.5 tablets (5 mg total) by mouth daily. 1/2 tab qd 90 tablet 1   No current facility-administered medications for this visit.    SUMMARY OF ONCOLOGIC HISTORY: Oncology History Overview Note  Rai Stage 0 FISH is within normal limits   CLL (chronic lymphocytic leukemia) (HCC)  09/14/2013 Procedure   Flow cytometry confirmed diagnosis of CLL. The monoclonal kappa positive B cell stained positive for CD5, CD19, CD20 and CD23 and negative for lambda light chain, CD10 and FMC-7   09/15/2013 Initial Diagnosis   CLL (chronic lymphocytic leukemia)   02/23/2021 Cancer Staging   Staging form: Chronic Lymphocytic Leukemia / Small Lymphocytic Lymphoma, AJCC 8th Edition - Clinical stage from 02/23/2021: Modified Rai Stage 0 (Modified Rai risk: Low, Lymphocytosis: Present, Adenopathy: Absent, Organomegaly: Absent, Anemia: Absent, Thrombocytopenia: Absent) - Signed by Artis Delay, MD on 02/23/2021     PHYSICAL EXAMINATION: ECOG PERFORMANCE STATUS: 0 - Asymptomatic  Vitals:   05/06/23 0956  BP: 127/62  Pulse: 75  Resp: 18  Temp: 98.6 F (37 C)  SpO2: 99%   Filed Weights   05/06/23 0956  Weight: 163 lb 6.4 oz (74.1 kg)    GENERAL:alert, no distress and comfortable SKIN: skin color, texture, turgor are normal, no rashes or significant lesions EYES: normal, Conjunctiva are pink and non-injected, sclera clear OROPHARYNX:no exudate, no erythema and lips, buccal mucosa, and tongue normal  NECK: supple, thyroid normal size, non-tender, without nodularity LYMPH: She has  palpable lymphadenopathy at the base of her neck and bilateral axilla LUNGS: clear to auscultation and percussion with normal breathing effort HEART: regular rate & rhythm and no murmurs and no lower extremity edema ABDOMEN:abdomen soft, non-tender and normal bowel sounds Musculoskeletal:no cyanosis of digits and no clubbing  NEURO: alert & oriented x 3 with fluent speech, no focal motor/sensory deficits  LABORATORY DATA:  I have reviewed the data as listed    Component Value Date/Time   NA 139 02/25/2020 0935   NA 139 07/11/2017 0758   K 4.3 02/25/2020 0935   K 3.8 07/11/2017 0758   CL 106 02/25/2020 0935   CO2 25 02/25/2020 0935   CO2 25 07/11/2017 0758   GLUCOSE 100 (H) 02/25/2020 0935   GLUCOSE 104 07/11/2017 0758   BUN 16 02/25/2020 0935   BUN 12.2 07/11/2017 0758   CREATININE 0.95 02/25/2020 0935   CREATININE 0.9 07/11/2017 0758   CALCIUM 9.3 02/25/2020 0935   CALCIUM 9.2 07/11/2017 0758   PROT 6.9 02/25/2020 0935   PROT 6.8 07/11/2017 0758  ALBUMIN 4.0 02/25/2020 0935   ALBUMIN 4.1 07/11/2017 0758   AST 15 02/25/2020 0935   AST 19 07/11/2017 0758   ALT 12 02/25/2020 0935   ALT 16 07/11/2017 0758   ALKPHOS 70 02/25/2020 0935   ALKPHOS 57 07/11/2017 0758   BILITOT 0.4 02/25/2020 0935   BILITOT 0.48 07/11/2017 0758   GFRNONAA >60 02/25/2020 0935   GFRAA >60 02/25/2020 0935    No results found for: "SPEP", "UPEP"  Lab Results  Component Value Date   WBC 53.0 (HH) 05/06/2023   NEUTROABS PENDING 05/06/2023   HGB 12.6 05/06/2023   HCT 39.1 05/06/2023   MCV 94.7 05/06/2023   PLT 159 05/06/2023      Chemistry      Component Value Date/Time   NA 139 02/25/2020 0935   NA 139 07/11/2017 0758   K 4.3 02/25/2020 0935   K 3.8 07/11/2017 0758   CL 106 02/25/2020 0935   CO2 25 02/25/2020 0935   CO2 25 07/11/2017 0758   BUN 16 02/25/2020 0935   BUN 12.2 07/11/2017 0758   CREATININE 0.95 02/25/2020 0935   CREATININE 0.9 07/11/2017 0758      Component Value  Date/Time   CALCIUM 9.3 02/25/2020 0935   CALCIUM 9.2 07/11/2017 0758   ALKPHOS 70 02/25/2020 0935   ALKPHOS 57 07/11/2017 0758   AST 15 02/25/2020 0935   AST 19 07/11/2017 0758   ALT 12 02/25/2020 0935   ALT 16 07/11/2017 0758   BILITOT 0.4 02/25/2020 0935   BILITOT 0.48 07/11/2017 0758       RADIOGRAPHIC STUDIES: I have personally reviewed the radiological images as listed and agreed with the findings in the report. MM 3D SCREENING MAMMOGRAM BILATERAL BREAST  Result Date: 04/29/2023 CLINICAL DATA:  Screening. EXAM: DIGITAL SCREENING BILATERAL MAMMOGRAM WITH TOMOSYNTHESIS AND CAD TECHNIQUE: Bilateral screening digital craniocaudal and mediolateral oblique mammograms were obtained. Bilateral screening digital breast tomosynthesis was performed. The images were evaluated with computer-aided detection. COMPARISON:  Previous exam(s). ACR Breast Density Category c: The breasts are heterogeneously dense, which may obscure small masses. FINDINGS: There are bilateral enlarged axillary lymph nodes. They may be slightly larger than the last time they were included within the field of view which was on her mammogram from 10/10/2017. The patient has known CLL. There are no findings suspicious for malignancy. IMPRESSION: 1. No mammographic evidence of malignancy. A result letter of this screening mammogram will be mailed directly to the patient. 2. Bilateral axillary lymphadenopathy, slightly larger than when visualized in 2019. The patient has known CLL. RECOMMENDATION: 1.  Screening mammogram in one year. (Code:[id number]-B-01Y) 2. An epic message was sent to Dr. Tanya Nones with information regarding her lymphadenopathy. Her doctor replied that this will discuss this with her at an upcoming visit. BI-RADS CATEGORY  1: Negative. Electronically Signed   By: Frederico Hamman M.D.   On: 04/29/2023 12:21

## 2023-05-06 NOTE — Assessment & Plan Note (Signed)
She has probable axillary lymphadenopathy and that was also seen on recent mammogram It could be related to CLL or breast cancer I will order CT imaging first for assessment and she agreed

## 2023-05-06 NOTE — Assessment & Plan Note (Signed)
She has slight progression of her lymphocytosis She also have palpable lymphadenopathy Recent bilateral lymphadenopathy seen on mammogram could be related to CLL but other type of malignancies cannot be excluded We discussed the risk and benefits of ordering additional workup I will order CMP and LDH as well as CT imaging of the chest, abdomen and pelvis for staging I will hold off repeating prognostic markers for CLL until we review test results There is a possibility we may not need to treat her yet

## 2023-05-11 ENCOUNTER — Ambulatory Visit (HOSPITAL_BASED_OUTPATIENT_CLINIC_OR_DEPARTMENT_OTHER)
Admission: RE | Admit: 2023-05-11 | Discharge: 2023-05-11 | Disposition: A | Discharge status: Home / Self Care | Payer: Medicare HMO | Source: Ambulatory Visit | Attending: Hematology and Oncology | Admitting: Hematology and Oncology

## 2023-05-11 DIAGNOSIS — R591 Generalized enlarged lymph nodes: Secondary | ICD-10-CM | POA: Diagnosis not present

## 2023-05-11 DIAGNOSIS — C9110 Chronic lymphocytic leukemia of B-cell type not having achieved remission: Secondary | ICD-10-CM | POA: Diagnosis not present

## 2023-05-11 DIAGNOSIS — I700 Atherosclerosis of aorta: Secondary | ICD-10-CM | POA: Diagnosis not present

## 2023-05-11 DIAGNOSIS — K8020 Calculus of gallbladder without cholecystitis without obstruction: Secondary | ICD-10-CM | POA: Diagnosis not present

## 2023-05-11 MED ORDER — IOHEXOL 300 MG/ML  SOLN
100.0000 mL | Freq: Once | INTRAMUSCULAR | Status: AC | PRN
  Administered 2023-05-11: 100 mL via INTRAVENOUS

## 2023-05-12 ENCOUNTER — Telehealth: Payer: Self-pay

## 2023-05-12 NOTE — Telephone Encounter (Signed)
Called and left a message asking her to call the office back. Offering appt tomorrow at 2:40 pm to review CT results.

## 2023-05-13 ENCOUNTER — Inpatient Hospital Stay: Payer: Medicare HMO | Admitting: Hematology and Oncology

## 2023-05-13 ENCOUNTER — Encounter: Payer: Self-pay | Admitting: Hematology and Oncology

## 2023-05-13 VITALS — BP 139/73 | HR 83 | Temp 98.6°F | Resp 18 | Ht 65.0 in | Wt 162.6 lb

## 2023-05-13 DIAGNOSIS — Z79899 Other long term (current) drug therapy: Secondary | ICD-10-CM | POA: Diagnosis not present

## 2023-05-13 DIAGNOSIS — Z7982 Long term (current) use of aspirin: Secondary | ICD-10-CM | POA: Diagnosis not present

## 2023-05-13 DIAGNOSIS — C9110 Chronic lymphocytic leukemia of B-cell type not having achieved remission: Secondary | ICD-10-CM

## 2023-05-13 DIAGNOSIS — R590 Localized enlarged lymph nodes: Secondary | ICD-10-CM | POA: Diagnosis not present

## 2023-05-13 NOTE — Progress Notes (Signed)
[name] OFFICE PROGRESS NOTE  Patient Care Team: Alysia Penna, MD as PCP - General (Internal Medicine) Artis Delay, MD as Consulting Physician (Hematology and Oncology)  ASSESSMENT & PLAN:  CLL (chronic lymphocytic leukemia) I have reviewed CT imaging with the patient and her daughter Overall, the pattern of lymphadenopathy is consistent with CLL At this point in time, I do not feel strongly to recommend biopsy of her axillary lymphadenopathy Despite worsening lymphocytosis over the years, her lymphocyte doubling time is still measured in years.  She has no B symptoms She has no signs of pancytopenia She denies abnormal night sweats or weight loss Overall, she does not need to be treated and can be observed We discussed signs and symptoms to watch out for cancer progression In the absence of those symptoms, I plan to see her once a year and she is in agreement I have addressed all her questions and concerns We discussed importance of annual influenza vaccination and other prophylactic vaccination   No orders of the defined types were placed in this encounter.   All questions were answered. The patient knows to call the clinic with any problems, questions or concerns. The total time spent in the appointment was 30 minutes encounter with patients including review of chart and various tests results, discussions about plan of care and coordination of care plan   Artis Delay, MD 05/13/2023 3:00 PM  INTERVAL HISTORY: Please see below for problem oriented charting. she returns for review test results She is here with her daughter We reviewed blood work and imaging studies  REVIEW OF SYSTEMS:   Constitutional: Denies fevers, chills or abnormal weight loss Eyes: Denies blurriness of vision Ears, nose, mouth, throat, and face: Denies mucositis or sore throat Respiratory: Denies cough, dyspnea or wheezes Cardiovascular: Denies palpitation, chest discomfort or lower  extremity swelling Gastrointestinal:  Denies nausea, heartburn or change in bowel habits Skin: Denies abnormal skin rashes Lymphatics: Denies new lymphadenopathy or easy bruising Neurological:Denies numbness, tingling or new weaknesses Behavioral/Psych: Mood is stable, no new changes  All other systems were reviewed with the patient and are negative.  I have reviewed the past medical history, past surgical history, social history and family history with the patient and they are unchanged from previous note.  ALLERGIES:  has No Known Allergies.  MEDICATIONS:  Current Outpatient Medications  Medication Sig Dispense Refill   aspirin 81 MG tablet Take 81 mg by mouth daily.     cholecalciferol (VITAMIN D) 1000 UNITS tablet Take 1,000 Units by mouth daily.     estradiol (VIVELLE-DOT) 0.05 MG/24HR patch Place 1 patch onto the skin once a week.     levocetirizine (XYZAL) 5 MG tablet Take 5 mg by mouth every evening.     levothyroxine (SYNTHROID, LEVOTHROID) 50 MCG tablet TAKE 1 TABLET BY MOUTH EVERY DAY. 90 tablet 1   lisinopril (PRINIVIL,ZESTRIL) 5 MG tablet Take 1 tablet (5 mg total) by mouth daily. PATIENT NEEDS OFFICE VISIT FOR ADDITIONAL REFILLS 90 tablet 1   montelukast (SINGULAIR) 10 MG tablet Take 10 mg by mouth at bedtime.     progesterone (PROMETRIUM) 200 MG capsule Take 200 mg by mouth as directed. Every other month     rosuvastatin (CRESTOR) 10 MG tablet Take 0.5 tablets (5 mg total) by mouth daily. 1/2 tab qd 90 tablet 1   No current facility-administered medications for this visit.    SUMMARY OF ONCOLOGIC HISTORY: Oncology History Overview Note  FISH normal from 12/09/2013  CLL (chronic lymphocytic leukemia) (HCC)  09/14/2013 Procedure   Flow cytometry confirmed diagnosis of CLL. The monoclonal kappa positive B cell stained positive for CD5, CD19, CD20 and CD23 and negative for lambda light chain, CD10 and FMC-7   09/15/2013 Initial Diagnosis   CLL (chronic lymphocytic  leukemia)   02/23/2021 Cancer Staging   Staging form: Chronic Lymphocytic Leukemia / Small Lymphocytic Lymphoma, AJCC 8th Edition - Clinical stage from 02/23/2021: Modified Rai Stage II (Modified Rai risk: Intermediate, Lymphocytosis: Present, Adenopathy: Present, Organomegaly: Present, Anemia: Absent, Thrombocytopenia: Absent) - Signed by Artis Delay, MD on 05/13/2023   05/13/2023 Imaging   CT CHEST ABDOMEN PELVIS W CONTRAST  Result Date: 05/12/2023 CLINICAL DATA:  Staging of chronic lymphocytic leukemia. * Tracking Code: BO * EXAM: CT CHEST, ABDOMEN, AND PELVIS WITH CONTRAST TECHNIQUE: Multidetector CT imaging of the chest, abdomen and pelvis was performed following the standard protocol during bolus administration of intravenous contrast. RADIATION DOSE REDUCTION: This exam was performed according to the departmental dose-optimization program which includes automated exposure control, adjustment of the mA and/or kV according to patient size and/or use of iterative reconstruction technique. CONTRAST:  OMNIPAQUE IOHEXOL 300 MG/ML  SOLN COMPARISON:  07/04/2010 CTA chest.  No more recent CTs available. FINDINGS: CT CHEST FINDINGS Cardiovascular: Aortic atherosclerosis. Tortuous thoracic aorta. Normal heart size, without pericardial effusion. No central pulmonary embolism, on this non-dedicated study. Mediastinum/Nodes: Multiple small right low cervical and submandibular nodes included on 01/02. Development of multiple bilateral axillary nodes. Example left axillary node of 1.4 cm in 14/2. Left subpectoral adenopathy is mild at 8 mm on 12/02. No mediastinal or hilar adenopathy. Lungs/Pleura: No pleural fluid. Minimal right lower lobe scarring laterally. 3 mm left lower lobe pulmonary nodule is similar to on the prior and considered benign. Musculoskeletal: No acute osseous abnormality. CT ABDOMEN PELVIS FINDINGS Hepatobiliary: Borderline hepatomegaly at 17.5 cm craniocaudal. Subtle gallstones without acute  cholecystitis or biliary duct dilatation. Pancreas: Fatty replaced pancreatic head and uncinate process. Spleen: Upper normal splenic size, 12.7 cm craniocaudal. No focal abnormality. Adrenals/Urinary Tract: Normal adrenal glands. Normal kidneys, without hydronephrosis. Normal urinary bladder. Stomach/Bowel: Normal stomach, without wall thickening. Scattered colonic diverticula. Normal terminal ileum and appendix. Normal small bowel. Vascular/Lymphatic: Aortic atherosclerosis. No abdominal adenopathy. Right external iliac adenopathy at 1.3 cm on 111/2. Left external iliac/inguinal node measures 1.1 cm on 114/2. Left external iliac node measures 1.6 cm on 105/2. Reproductive: Uterine calcifications could be vascular or represent underlying tiny dystrophic fibroids. No adnexal mass. Other: No significant free fluid. No evidence of omental or peritoneal disease. Musculoskeletal: No acute osseous abnormality. Mild S shaped thoracolumbar spine curvature. IMPRESSION: 1. Mild thoracic and pelvic sidewall adenopathy, most consistent with active lymphoma/leukemia. 2. Upper normal splenic size, nonspecific. 3. Cholelithiasis 4.  Aortic Atherosclerosis (ICD10-I70.0). Electronically Signed   By: Jeronimo Greaves M.D.   On: 05/12/2023 14:57   MM 3D SCREENING MAMMOGRAM BILATERAL BREAST  Result Date: 04/29/2023 CLINICAL DATA:  Screening. EXAM: DIGITAL SCREENING BILATERAL MAMMOGRAM WITH TOMOSYNTHESIS AND CAD TECHNIQUE: Bilateral screening digital craniocaudal and mediolateral oblique mammograms were obtained. Bilateral screening digital breast tomosynthesis was performed. The images were evaluated with computer-aided detection. COMPARISON:  Previous exam(s). ACR Breast Density Category c: The breasts are heterogeneously dense, which may obscure small masses. FINDINGS: There are bilateral enlarged axillary lymph nodes. They may be slightly larger than the last time they were included within the field of view which was on her  mammogram from 10/10/2017. The patient has known CLL. There are  no findings suspicious for malignancy. IMPRESSION: 1. No mammographic evidence of malignancy. A result letter of this screening mammogram will be mailed directly to the patient. 2. Bilateral axillary lymphadenopathy, slightly larger than when visualized in 2019. The patient has known CLL. RECOMMENDATION: 1.  Screening mammogram in one year. (Code:[id number]-[id number]) 2. An epic message was sent to Dr. Tanya Nones with information regarding her lymphadenopathy. Her doctor replied that this will discuss this with her at an upcoming visit. BI-RADS CATEGORY  1: Negative. Electronically Signed   By: Frederico Hamman M.D.   On: 04/29/2023 12:21       Imaging       PHYSICAL EXAMINATION: ECOG PERFORMANCE STATUS: 0 - Asymptomatic  Vitals:   05/13/23 1438  BP: 139/73  Pulse: 83  Resp: 18  Temp: 98.6 F (37 C)  SpO2: 100%   Filed Weights   05/13/23 1438  Weight: 162 lb 9.6 oz (73.8 kg)    GENERAL:alert, no distress and comfortable NEURO: alert & oriented x 3 with fluent speech, no focal motor/sensory deficits  LABORATORY DATA:  I have reviewed the data as listed    Component Value Date/Time   NA 137 05/06/2023 0953   NA 139 07/11/2017 0758   K 4.5 05/06/2023 0953   K 3.8 07/11/2017 0758   CL 104 05/06/2023 0953   CO2 29 05/06/2023 0953   CO2 25 07/11/2017 0758   GLUCOSE 102 (H) 05/06/2023 0953   GLUCOSE 104 07/11/2017 0758   BUN 18 05/06/2023 0953   BUN 12.2 07/11/2017 0758   CREATININE 0.88 05/06/2023 0953   CREATININE 0.9 07/11/2017 0758   CALCIUM 9.1 05/06/2023 0953   CALCIUM 9.2 07/11/2017 0758   PROT 6.8 05/06/2023 0953   PROT 6.8 07/11/2017 0758   ALBUMIN 4.4 05/06/2023 0953   ALBUMIN 4.1 07/11/2017 0758   AST 14 (L) 05/06/2023 0953   AST 19 07/11/2017 0758   ALT 9 05/06/2023 0953   ALT 16 07/11/2017 0758   ALKPHOS 63 05/06/2023 0953   ALKPHOS 57 07/11/2017 0758   BILITOT 0.4 05/06/2023 0953   BILITOT 0.48  07/11/2017 0758   GFRNONAA >60 05/06/2023 0953   GFRAA >60 02/25/2020 0935    No results found for: "SPEP", "UPEP"  Lab Results  Component Value Date   WBC 53.0 (HH) 05/06/2023   NEUTROABS 3.7 05/06/2023   HGB 12.6 05/06/2023   HCT 39.1 05/06/2023   MCV 94.7 05/06/2023   PLT 159 05/06/2023      Chemistry      Component Value Date/Time   NA 137 05/06/2023 0953   NA 139 07/11/2017 0758   K 4.5 05/06/2023 0953   K 3.8 07/11/2017 0758   CL 104 05/06/2023 0953   CO2 29 05/06/2023 0953   CO2 25 07/11/2017 0758   BUN 18 05/06/2023 0953   BUN 12.2 07/11/2017 0758   CREATININE 0.88 05/06/2023 0953   CREATININE 0.9 07/11/2017 0758      Component Value Date/Time   CALCIUM 9.1 05/06/2023 0953   CALCIUM 9.2 07/11/2017 0758   ALKPHOS 63 05/06/2023 0953   ALKPHOS 57 07/11/2017 0758   AST 14 (L) 05/06/2023 0953   AST 19 07/11/2017 0758   ALT 9 05/06/2023 0953   ALT 16 07/11/2017 0758   BILITOT 0.4 05/06/2023 0953   BILITOT 0.48 07/11/2017 0758       RADIOGRAPHIC STUDIES: I have reviewed CT imaging with the patient and family I have personally reviewed the radiological images as listed and agreed with  the findings in the report. CT CHEST ABDOMEN PELVIS W CONTRAST  Result Date: 05/12/2023 CLINICAL DATA:  Staging of chronic lymphocytic leukemia. * Tracking Code: BO * EXAM: CT CHEST, ABDOMEN, AND PELVIS WITH CONTRAST TECHNIQUE: Multidetector CT imaging of the chest, abdomen and pelvis was performed following the standard protocol during bolus administration of intravenous contrast. RADIATION DOSE REDUCTION: This exam was performed according to the departmental dose-optimization program which includes automated exposure control, adjustment of the mA and/or kV according to patient size and/or use of iterative reconstruction technique. CONTRAST:  OMNIPAQUE IOHEXOL 300 MG/ML  SOLN COMPARISON:  07/04/2010 CTA chest.  No more recent CTs available. FINDINGS: CT CHEST FINDINGS  Cardiovascular: Aortic atherosclerosis. Tortuous thoracic aorta. Normal heart size, without pericardial effusion. No central pulmonary embolism, on this non-dedicated study. Mediastinum/Nodes: Multiple small right low cervical and submandibular nodes included on 01/02. Development of multiple bilateral axillary nodes. Example left axillary node of 1.4 cm in 14/2. Left subpectoral adenopathy is mild at 8 mm on 12/02. No mediastinal or hilar adenopathy. Lungs/Pleura: No pleural fluid. Minimal right lower lobe scarring laterally. 3 mm left lower lobe pulmonary nodule is similar to on the prior and considered benign. Musculoskeletal: No acute osseous abnormality. CT ABDOMEN PELVIS FINDINGS Hepatobiliary: Borderline hepatomegaly at 17.5 cm craniocaudal. Subtle gallstones without acute cholecystitis or biliary duct dilatation. Pancreas: Fatty replaced pancreatic head and uncinate process. Spleen: Upper normal splenic size, 12.7 cm craniocaudal. No focal abnormality. Adrenals/Urinary Tract: Normal adrenal glands. Normal kidneys, without hydronephrosis. Normal urinary bladder. Stomach/Bowel: Normal stomach, without wall thickening. Scattered colonic diverticula. Normal terminal ileum and appendix. Normal small bowel. Vascular/Lymphatic: Aortic atherosclerosis. No abdominal adenopathy. Right external iliac adenopathy at 1.3 cm on 111/2. Left external iliac/inguinal node measures 1.1 cm on 114/2. Left external iliac node measures 1.6 cm on 105/2. Reproductive: Uterine calcifications could be vascular or represent underlying tiny dystrophic fibroids. No adnexal mass. Other: No significant free fluid. No evidence of omental or peritoneal disease. Musculoskeletal: No acute osseous abnormality. Mild S shaped thoracolumbar spine curvature. IMPRESSION: 1. Mild thoracic and pelvic sidewall adenopathy, most consistent with active lymphoma/leukemia. 2. Upper normal splenic size, nonspecific. 3. Cholelithiasis 4.  Aortic  Atherosclerosis (ICD10-I70.0). Electronically Signed   By: Jeronimo Greaves M.D.   On: 05/12/2023 14:57   MM 3D SCREENING MAMMOGRAM BILATERAL BREAST  Result Date: 04/29/2023 CLINICAL DATA:  Screening. EXAM: DIGITAL SCREENING BILATERAL MAMMOGRAM WITH TOMOSYNTHESIS AND CAD TECHNIQUE: Bilateral screening digital craniocaudal and mediolateral oblique mammograms were obtained. Bilateral screening digital breast tomosynthesis was performed. The images were evaluated with computer-aided detection. COMPARISON:  Previous exam(s). ACR Breast Density Category c: The breasts are heterogeneously dense, which may obscure small masses. FINDINGS: There are bilateral enlarged axillary lymph nodes. They may be slightly larger than the last time they were included within the field of view which was on her mammogram from 10/10/2017. The patient has known CLL. There are no findings suspicious for malignancy. IMPRESSION: 1. No mammographic evidence of malignancy. A result letter of this screening mammogram will be mailed directly to the patient. 2. Bilateral axillary lymphadenopathy, slightly larger than when visualized in 2019. The patient has known CLL. RECOMMENDATION: 1.  Screening mammogram in one year. (Code:[id number]-[id number]) 2. An epic message was sent to Dr. Tanya Nones with information regarding her lymphadenopathy. Her doctor replied that this will discuss this with her at an upcoming visit. BI-RADS CATEGORY  1: Negative. Electronically Signed   By: Frederico Hamman M.D.   On: 04/29/2023 12:21

## 2023-05-13 NOTE — Telephone Encounter (Signed)
Called back and moved appt to 8/20 at 2:40 pm. She is aware of appt time/date.

## 2023-05-13 NOTE — Assessment & Plan Note (Signed)
I have reviewed CT imaging with the patient and her daughter Overall, the pattern of lymphadenopathy is consistent with CLL At this point in time, I do not feel strongly to recommend biopsy of her axillary lymphadenopathy Despite worsening lymphocytosis over the years, her lymphocyte doubling time is still measured in years.  She has no B symptoms She has no signs of pancytopenia She denies abnormal night sweats or weight loss Overall, she does not need to be treated and can be observed We discussed signs and symptoms to watch out for cancer progression In the absence of those symptoms, I plan to see her once a year and she is in agreement I have addressed all her questions and concerns We discussed importance of annual influenza vaccination and other prophylactic vaccination

## 2023-05-16 ENCOUNTER — Ambulatory Visit: Payer: Medicare HMO | Admitting: Hematology and Oncology

## 2023-09-10 DIAGNOSIS — Z23 Encounter for immunization: Secondary | ICD-10-CM | POA: Diagnosis not present

## 2024-01-06 DIAGNOSIS — I10 Essential (primary) hypertension: Secondary | ICD-10-CM | POA: Diagnosis not present

## 2024-01-06 DIAGNOSIS — E039 Hypothyroidism, unspecified: Secondary | ICD-10-CM | POA: Diagnosis not present

## 2024-01-06 DIAGNOSIS — E785 Hyperlipidemia, unspecified: Secondary | ICD-10-CM | POA: Diagnosis not present

## 2024-01-12 ENCOUNTER — Encounter: Payer: Self-pay | Admitting: Internal Medicine

## 2024-01-12 DIAGNOSIS — Z1331 Encounter for screening for depression: Secondary | ICD-10-CM | POA: Diagnosis not present

## 2024-01-12 DIAGNOSIS — I10 Essential (primary) hypertension: Secondary | ICD-10-CM | POA: Diagnosis not present

## 2024-01-12 DIAGNOSIS — E785 Hyperlipidemia, unspecified: Secondary | ICD-10-CM | POA: Diagnosis not present

## 2024-01-12 DIAGNOSIS — Z0000 Encounter for general adult medical examination without abnormal findings: Secondary | ICD-10-CM | POA: Diagnosis not present

## 2024-01-12 DIAGNOSIS — E039 Hypothyroidism, unspecified: Secondary | ICD-10-CM | POA: Diagnosis not present

## 2024-01-12 DIAGNOSIS — Z1339 Encounter for screening examination for other mental health and behavioral disorders: Secondary | ICD-10-CM | POA: Diagnosis not present

## 2024-01-12 DIAGNOSIS — J329 Chronic sinusitis, unspecified: Secondary | ICD-10-CM | POA: Diagnosis not present

## 2024-01-12 DIAGNOSIS — C9110 Chronic lymphocytic leukemia of B-cell type not having achieved remission: Secondary | ICD-10-CM | POA: Diagnosis not present

## 2024-01-13 ENCOUNTER — Telehealth: Payer: Self-pay

## 2024-01-13 NOTE — Telephone Encounter (Signed)
 Called and left a message and ask her to call the office for questions. Dr. Marton Sleeper reviewed her labs from PCP and her CLL is stable. Please keep 8/21 appt as scheduled.

## 2024-02-29 ENCOUNTER — Emergency Department (HOSPITAL_BASED_OUTPATIENT_CLINIC_OR_DEPARTMENT_OTHER)
Admission: EM | Admit: 2024-02-29 | Discharge: 2024-02-29 | Disposition: A | Discharge status: Home / Self Care | Attending: Emergency Medicine | Admitting: Emergency Medicine

## 2024-02-29 ENCOUNTER — Other Ambulatory Visit: Payer: Self-pay

## 2024-02-29 ENCOUNTER — Encounter (HOSPITAL_BASED_OUTPATIENT_CLINIC_OR_DEPARTMENT_OTHER): Payer: Self-pay

## 2024-02-29 DIAGNOSIS — S71152A Open bite, left thigh, initial encounter: Secondary | ICD-10-CM | POA: Diagnosis not present

## 2024-02-29 DIAGNOSIS — S81832A Puncture wound without foreign body, left lower leg, initial encounter: Secondary | ICD-10-CM | POA: Insufficient documentation

## 2024-02-29 DIAGNOSIS — W540XXA Bitten by dog, initial encounter: Secondary | ICD-10-CM | POA: Insufficient documentation

## 2024-02-29 DIAGNOSIS — Z23 Encounter for immunization: Secondary | ICD-10-CM | POA: Insufficient documentation

## 2024-02-29 DIAGNOSIS — S81852A Open bite, left lower leg, initial encounter: Secondary | ICD-10-CM | POA: Diagnosis not present

## 2024-02-29 DIAGNOSIS — Z7982 Long term (current) use of aspirin: Secondary | ICD-10-CM | POA: Insufficient documentation

## 2024-02-29 DIAGNOSIS — S71132A Puncture wound without foreign body, left thigh, initial encounter: Secondary | ICD-10-CM | POA: Diagnosis not present

## 2024-02-29 DIAGNOSIS — S8992XA Unspecified injury of left lower leg, initial encounter: Secondary | ICD-10-CM | POA: Diagnosis present

## 2024-02-29 MED ORDER — AMOXICILLIN-POT CLAVULANATE 875-125 MG PO TABS
1.0000 | ORAL_TABLET | Freq: Once | ORAL | Status: AC
  Administered 2024-02-29: 1 via ORAL
  Filled 2024-02-29: qty 1

## 2024-02-29 MED ORDER — AMOXICILLIN-POT CLAVULANATE 875-125 MG PO TABS: 1.0000 | ORAL_TABLET | Freq: Two times a day (BID) | ORAL | 0 refills | Status: AC

## 2024-02-29 MED ORDER — TETANUS-DIPHTH-ACELL PERTUSSIS 5-2.5-18.5 LF-MCG/0.5 IM SUSY
0.5000 mL | PREFILLED_SYRINGE | Freq: Once | INTRAMUSCULAR | Status: AC
  Administered 2024-02-29: 0.5 mL via INTRAMUSCULAR
  Filled 2024-02-29: qty 0.5

## 2024-02-29 NOTE — Discharge Instructions (Addendum)
 As we discussed wash wound off twice a day with soap and water.  Pat the wound completely dry Place bacitracin or Neosporin ointment over areas that are affected and cover.  Follow-up with your primary care doctor for wound return if you develop any of abscess or concerning symptoms

## 2024-02-29 NOTE — ED Triage Notes (Signed)
 She reports having been bitten "by a neighbor's dog that just came onto my property yesterday. Animal cotrol were notified and it was determined that the dog has up to date rabies prophylaxis. She has a large wound with eschar at distal left lat. Thigh 8x8 cm and some puncture bite wounds on proximal lateral left calf. The thigh wound is surrounded by ecchymosis. Her wounds are thoroughly cleansed by both Dr. Colonel Dears and my colleague, Shelvy Dickens with a dressing being applied.

## 2024-02-29 NOTE — ED Provider Notes (Signed)
 [facility] AT [facility] Provider Note   CSN: [id number] Arrival date & time: 02/29/24  6213     History  Chief Complaint  Patient presents with   Animal Bite    Kelly [name] is a 68 y.o. female.  Patient here with dog bite to her left lower leg.  Neighbors dog bit her in the left upper leg and left lower leg late last evening.  Dog shots are up-to-date per the patient.  She needs a tetanus shot updated.  She denies any weakness numbness tingling.  She washed out the wounds last night.  She denies any fevers or chills.  No drainage.  The history is provided by the patient.       Home Medications Prior to Admission medications   Medication Sig Start Date End Date Taking? Authorizing Provider  amoxicillin -clavulanate (AUGMENTIN ) 875-125 MG tablet Take 1 tablet by mouth every 12 (twelve) hours for 10 days. 02/29/24 03/10/24 Yes Dezaray Shibuya, DO  aspirin 81 MG tablet Take 81 mg by mouth daily.    [provider]  cholecalciferol (VITAMIN D) 1000 UNITS tablet Take 1,000 Units by mouth daily.    [provider]  estradiol (VIVELLE-DOT) 0.05 MG/24HR patch Place 1 patch onto the skin once a week.    [provider]  levocetirizine (XYZAL) 5 MG tablet Take 5 mg by mouth every evening.    [provider]  levothyroxine  (SYNTHROID , LEVOTHROID) 50 MCG tablet TAKE 1 TABLET BY MOUTH EVERY DAY. 09/22/14   [place], PA-C  lisinopril  (PRINIVIL ,ZESTRIL ) 5 MG tablet Take 1 tablet (5 mg total) by mouth daily. PATIENT NEEDS OFFICE VISIT FOR ADDITIONAL REFILLS 09/22/14   [place], PA-C  montelukast (SINGULAIR) 10 MG tablet Take 10 mg by mouth at bedtime.    [provider]  progesterone (PROMETRIUM) 200 MG capsule Take 200 mg by mouth as directed. Every other month    [provider]  rosuvastatin  (CRESTOR ) 10 MG tablet Take 0.5 tablets (5 mg total) by mouth daily. 1/2 tab qd 09/22/14   [place],  PA-C      Allergies    Patient has no known allergies.    Review of Systems   Review of Systems  Physical Exam Updated Vital Signs BP 138/72 (BP Location: Right Arm)   Pulse 91   Temp 98.3 F (36.8 C) (Oral)   Resp 18   Ht 5\' 5"  (1.651 m)   Wt 72.6 kg   SpO2 97%   BMI 26.63 kg/m  Physical Exam Vitals and nursing note reviewed.  Constitutional:      General: She is not in acute distress.    Appearance: She is well-developed.  HENT:     Head: Normocephalic and atraumatic.  Eyes:     Conjunctiva/sclera: Conjunctivae normal.  Cardiovascular:     Rate and Rhythm: Normal rate and regular rhythm.     Pulses: Normal pulses.     Heart sounds: No murmur heard. Pulmonary:     Effort: Pulmonary effort is normal. No respiratory distress.     Breath sounds: Normal breath sounds.  Abdominal:     Palpations: Abdomen is soft.     Tenderness: There is no abdominal tenderness.  Musculoskeletal:        General: No swelling.     Cervical back: Neck supple.  Skin:    General: Skin is warm and dry.     Capillary Refill: Capillary refill takes less than 2  seconds.     Comments: Multiple puncture wounds to the left shin and abrasion/wound to the left lateral thigh, there is no purulence or erythema, there is some ecchymosis around the wounds of the thigh and the shin, there is no involvement of the joint, superficial puncture wounds otherwise  Neurological:     Mental Status: She is alert.     Sensory: No sensory deficit.     Motor: No weakness.  Psychiatric:        Mood and Affect: Mood normal.     ED Results / Procedures / Treatments   Labs (all labs ordered are listed, but only abnormal results are displayed) Labs Reviewed - No data to display  EKG None  Radiology No results found.  Procedures Procedures    Medications Ordered in ED Medications  Tdap (BOOSTRIX) injection 0.5 mL (has no administration in time range)  amoxicillin -clavulanate (AUGMENTIN ) 875-125 MG  per tablet 1 tablet (has no administration in time range)    ED Course/ Medical Decision Making/ A&P                                 Medical Decision Making Risk Prescription drug management.   Kelly [name] is here with dog bite.  Normal vitals.  No fever.  Dog belongs to the neighbors.  Dog shots are up-to-date per the patient.  She will need her tetanus shot updated which was done.  She is got multiple puncture wounds to the left shin and the left lateral thigh.  Superficial no signs of major infectious process.  There are some ecchymosis around these wounds.  Wound had already been washed out last night when this occurred around 8 hours ago.  Wounds were thoroughly washed and irrigated here by myself and nursing staff.  Patient given Augmentin .  Will prescribe Augmentin .  Tetanus shot updated.  Recommend bacitracin Neosporin ointment twice daily to the wound and wound care instructions given and return precautions given and follow-up information given.  Patient had wound dressed with bacitracin prior to discharge as well.  Neurovascular neuromuscular intact otherwise.  Discharged in good condition.  This chart was dictated using voice recognition software.  Despite best efforts to proofread,  errors can occur which can change the documentation meaning.         Final Clinical Impression(s) / ED Diagnoses Final diagnoses:  Dog bite, initial encounter    Rx / DC Orders ED Discharge Orders          Ordered    amoxicillin -clavulanate (AUGMENTIN ) 875-125 MG tablet  Every 12 hours        02/29/24 0749              Lowery Rue, DO 02/29/24 [phone]

## 2024-03-03 DIAGNOSIS — W540XXA Bitten by dog, initial encounter: Secondary | ICD-10-CM | POA: Diagnosis not present

## 2024-03-03 DIAGNOSIS — I10 Essential (primary) hypertension: Secondary | ICD-10-CM | POA: Diagnosis not present

## 2024-03-03 DIAGNOSIS — M79605 Pain in left leg: Secondary | ICD-10-CM | POA: Diagnosis not present

## 2024-03-03 DIAGNOSIS — F4310 Post-traumatic stress disorder, unspecified: Secondary | ICD-10-CM | POA: Diagnosis not present

## 2024-03-03 DIAGNOSIS — F419 Anxiety disorder, unspecified: Secondary | ICD-10-CM | POA: Diagnosis not present

## 2024-03-17 DIAGNOSIS — R234 Changes in skin texture: Secondary | ICD-10-CM | POA: Diagnosis not present

## 2024-03-17 DIAGNOSIS — F4310 Post-traumatic stress disorder, unspecified: Secondary | ICD-10-CM | POA: Diagnosis not present

## 2024-03-17 DIAGNOSIS — M79605 Pain in left leg: Secondary | ICD-10-CM | POA: Diagnosis not present

## 2024-03-17 DIAGNOSIS — I10 Essential (primary) hypertension: Secondary | ICD-10-CM | POA: Diagnosis not present

## 2024-03-17 DIAGNOSIS — W540XXD Bitten by dog, subsequent encounter: Secondary | ICD-10-CM | POA: Diagnosis not present

## 2024-03-17 DIAGNOSIS — F419 Anxiety disorder, unspecified: Secondary | ICD-10-CM | POA: Diagnosis not present

## 2024-03-25 DIAGNOSIS — M79605 Pain in left leg: Secondary | ICD-10-CM | POA: Diagnosis not present

## 2024-03-25 DIAGNOSIS — R234 Changes in skin texture: Secondary | ICD-10-CM | POA: Diagnosis not present

## 2024-04-14 DIAGNOSIS — F4310 Post-traumatic stress disorder, unspecified: Secondary | ICD-10-CM | POA: Diagnosis not present

## 2024-04-14 DIAGNOSIS — I10 Essential (primary) hypertension: Secondary | ICD-10-CM | POA: Diagnosis not present

## 2024-04-14 DIAGNOSIS — T148XXA Other injury of unspecified body region, initial encounter: Secondary | ICD-10-CM | POA: Diagnosis not present

## 2024-04-14 DIAGNOSIS — T148XXD Other injury of unspecified body region, subsequent encounter: Secondary | ICD-10-CM | POA: Diagnosis not present

## 2024-04-14 DIAGNOSIS — L98492 Non-pressure chronic ulcer of skin of other sites with fat layer exposed: Secondary | ICD-10-CM | POA: Diagnosis not present

## 2024-04-16 DIAGNOSIS — L98492 Non-pressure chronic ulcer of skin of other sites with fat layer exposed: Secondary | ICD-10-CM | POA: Diagnosis not present

## 2024-04-16 DIAGNOSIS — T148XXD Other injury of unspecified body region, subsequent encounter: Secondary | ICD-10-CM | POA: Diagnosis not present

## 2024-04-21 DIAGNOSIS — T148XXD Other injury of unspecified body region, subsequent encounter: Secondary | ICD-10-CM | POA: Diagnosis not present

## 2024-04-21 DIAGNOSIS — L98492 Non-pressure chronic ulcer of skin of other sites with fat layer exposed: Secondary | ICD-10-CM | POA: Diagnosis not present

## 2024-05-03 ENCOUNTER — Encounter: Payer: Self-pay | Admitting: Hematology and Oncology

## 2024-05-12 ENCOUNTER — Encounter: Payer: Self-pay | Admitting: Gastroenterology

## 2024-05-12 ENCOUNTER — Other Ambulatory Visit: Payer: Self-pay | Admitting: Internal Medicine

## 2024-05-12 DIAGNOSIS — Z1231 Encounter for screening mammogram for malignant neoplasm of breast: Secondary | ICD-10-CM

## 2024-05-13 ENCOUNTER — Inpatient Hospital Stay: Payer: Medicare HMO | Attending: Hematology and Oncology

## 2024-05-13 ENCOUNTER — Inpatient Hospital Stay (HOSPITAL_BASED_OUTPATIENT_CLINIC_OR_DEPARTMENT_OTHER): Payer: Medicare HMO | Admitting: Hematology and Oncology

## 2024-05-13 ENCOUNTER — Encounter: Payer: Self-pay | Admitting: Hematology and Oncology

## 2024-05-13 VITALS — BP 138/77 | HR 83 | Temp 98.9°F | Resp 18 | Ht 65.0 in | Wt 169.4 lb

## 2024-05-13 DIAGNOSIS — C9110 Chronic lymphocytic leukemia of B-cell type not having achieved remission: Secondary | ICD-10-CM

## 2024-05-13 LAB — CBC WITH DIFFERENTIAL/PLATELET
Abs Immature Granulocytes: 0.05 K/uL (ref 0.00–0.07)
Basophils Absolute: 0 K/uL (ref 0.0–0.1)
Basophils Relative: 0 %
Eosinophils Absolute: 0.1 K/uL (ref 0.0–0.5)
Eosinophils Relative: 0 %
HCT: 38.5 % (ref 36.0–46.0)
Hemoglobin: 12.5 g/dL (ref 12.0–15.0)
Immature Granulocytes: 0 %
Lymphocytes Relative: 92 %
Lymphs Abs: 46 K/uL — ABNORMAL HIGH (ref 0.7–4.0)
MCH: 30.1 pg (ref 26.0–34.0)
MCHC: 32.5 g/dL (ref 30.0–36.0)
MCV: 92.8 fL (ref 80.0–100.0)
Monocytes Absolute: 0.2 K/uL (ref 0.1–1.0)
Monocytes Relative: 1 %
Neutro Abs: 3.3 K/uL (ref 1.7–7.7)
Neutrophils Relative %: 7 %
Platelets: 161 K/uL (ref 150–400)
RBC: 4.15 MIL/uL (ref 3.87–5.11)
RDW: 13.2 % (ref 11.5–15.5)
WBC: 49.8 K/uL — ABNORMAL HIGH (ref 4.0–10.5)
nRBC: 0 % (ref 0.0–0.2)

## 2024-05-13 NOTE — Progress Notes (Signed)
 [facility] OFFICE PROGRESS NOTE  Patient Care Team: Larnell Hamilton, MD as PCP - General (Internal Medicine) Lonn Hicks, MD as Consulting Physician (Hematology and Oncology)  Assessment & Plan CLL (chronic lymphocytic leukemia) [name]) The patient was diagnosed with CLL in December 2014, modified Rai stage II with lymphocytosis and adenopathy seen on imaging only, nonpalpable on exam She is on observation  Overall, she remained asymptomatic Examination is benign and labs are stable I will see her once a year  No orders of the defined types were placed in this encounter.    Hicks Lonn, MD  INTERVAL HISTORY: she returns for surveillance follow-up for CLL I have reviewed outside records The patient sustained a significant dog bite in July Her wound subsequently healed with conservative approach and antibiotics Otherwise, she feels well No new lymphadenopathy, abnormal weight loss or changes in appetite  PHYSICAL EXAMINATION: ECOG PERFORMANCE STATUS: 0 - Asymptomatic  Vitals:   05/13/24 0849  BP: 138/77  Pulse: 83  Resp: 18  Temp: 98.9 F (37.2 C)  SpO2: 100%   Filed Weights   05/13/24 0849  Weight: 169 lb 6.4 oz (76.8 kg)   GENERAL:alert, no distress and comfortable SKIN: skin color, texture, turgor are normal, no rashes or significant lesions EYES: normal, conjunctiva are pink and non-injected, sclera clear OROPHARYNX:no exudate, no erythema and lips, buccal mucosa, and tongue normal  NECK: supple, thyroid  normal size, non-tender, without nodularity LYMPH:  no palpable lymphadenopathy in the cervical, axillary or inguinal LUNGS: clear to auscultation and percussion with normal breathing effort HEART: regular rate & rhythm and no murmurs and no lower extremity edema ABDOMEN:abdomen soft, non-tender and normal bowel sounds Musculoskeletal:no cyanosis of digits and no clubbing  PSYCH: alert & oriented x 3 with fluent speech NEURO: no focal  motor/sensory deficits  Relevant data reviewed during this visit included CBC and outside records

## 2024-05-13 NOTE — Assessment & Plan Note (Addendum)
 The patient was diagnosed with CLL in December 2014, modified Rai stage II with lymphocytosis and adenopathy seen on imaging only, nonpalpable on exam She is on observation  Overall, she remained asymptomatic Examination is benign and labs are stable I will see her once a year

## 2024-05-26 ENCOUNTER — Ambulatory Visit
Admission: RE | Admit: 2024-05-26 | Discharge: 2024-05-26 | Disposition: A | Discharge status: Home / Self Care | Source: Ambulatory Visit

## 2024-05-26 DIAGNOSIS — Z1231 Encounter for screening mammogram for malignant neoplasm of breast: Secondary | ICD-10-CM

## 2024-06-04 ENCOUNTER — Encounter: Payer: Self-pay | Admitting: Gastroenterology

## 2024-06-04 ENCOUNTER — Ambulatory Visit: Admitting: *Deleted

## 2024-06-04 VITALS — Ht 66.0 in | Wt 165.0 lb

## 2024-06-04 DIAGNOSIS — Z1211 Encounter for screening for malignant neoplasm of colon: Secondary | ICD-10-CM

## 2024-06-04 MED ORDER — NA SULFATE-K SULFATE-MG SULF 17.5-3.13-1.6 GM/177ML PO SOLN
1.0000 | Freq: Once | ORAL | 0 refills | Status: AC
Start: 2024-06-04 — End: 2024-06-04

## 2024-06-04 NOTE — Progress Notes (Signed)
  Pre visit completed over telephone. Instructions forwarded through MyChart and secure email.    No egg or soy allergy known to patient  No issues known to pt with past sedation with any surgeries or procedures Patient denies ever being told they had issues or difficulty with intubation  No FH of Malignant Hyperthermia Pt is not on diet pills Pt is not on  home 02  Pt is not on blood thinners  Pt denies issues with constipation  No A fib or A flutter Have any cardiac testing pending-- NO Pt instructed to use Singlecare.[email or web address] or GoodRx for a price reduction on prep    Chart reviewed by DOROTHA Schillings, CRNA

## 2024-06-04 NOTE — Addendum Note (Signed)
 Addended by: KOLEEN PERKINS F on: 06/04/2024 10:25 AM   Modules accepted: Orders

## 2024-06-17 ENCOUNTER — Encounter: Payer: Self-pay | Admitting: Gastroenterology

## 2024-06-17 ENCOUNTER — Ambulatory Visit: Admitting: Gastroenterology

## 2024-06-17 VITALS — BP 118/65 | HR 74 | Temp 97.7°F | Resp 12 | Ht 66.0 in | Wt 165.0 lb

## 2024-06-17 DIAGNOSIS — K648 Other hemorrhoids: Secondary | ICD-10-CM | POA: Diagnosis not present

## 2024-06-17 DIAGNOSIS — D123 Benign neoplasm of transverse colon: Secondary | ICD-10-CM

## 2024-06-17 DIAGNOSIS — E785 Hyperlipidemia, unspecified: Secondary | ICD-10-CM | POA: Diagnosis not present

## 2024-06-17 DIAGNOSIS — K5730 Diverticulosis of large intestine without perforation or abscess without bleeding: Secondary | ICD-10-CM | POA: Diagnosis not present

## 2024-06-17 DIAGNOSIS — Q439 Congenital malformation of intestine, unspecified: Secondary | ICD-10-CM | POA: Diagnosis not present

## 2024-06-17 DIAGNOSIS — F419 Anxiety disorder, unspecified: Secondary | ICD-10-CM | POA: Diagnosis not present

## 2024-06-17 DIAGNOSIS — I10 Essential (primary) hypertension: Secondary | ICD-10-CM | POA: Diagnosis not present

## 2024-06-17 DIAGNOSIS — D128 Benign neoplasm of rectum: Secondary | ICD-10-CM | POA: Diagnosis not present

## 2024-06-17 DIAGNOSIS — Z1211 Encounter for screening for malignant neoplasm of colon: Secondary | ICD-10-CM

## 2024-06-17 MED ORDER — SODIUM CHLORIDE 0.9 % IV SOLN: 500.0000 mL | Freq: Once | INTRAVENOUS | Status: DC

## 2024-06-17 NOTE — Op Note (Signed)
 [facility] Patient Name: Kelly [name] Procedure Date: 06/17/2024 10:56 AM MRN: [id number] Endoscopist: Elspeth P. Leigh , MD, [id number] Age: 68 Referring MD:  Date of Birth: [birth year] Gender: Female Account #: [id number] Procedure:                Colonoscopy Indications:              Screening for colorectal malignant neoplasm - last                            exam 2015 - Dr. Aneita - normal Medicines:                Monitored Anesthesia Care Procedure:                Pre-Anesthesia Assessment:                           - Prior to the procedure, a History and Physical                            was performed, and patient medications and                            allergies were reviewed. The patient's tolerance of                            previous anesthesia was also reviewed. The risks                            and benefits of the procedure and the sedation                            options and risks were discussed with the patient.                            All questions were answered, and informed consent                            was obtained. Prior Anticoagulants: The patient has                            taken no anticoagulant or antiplatelet agents. ASA                            Grade Assessment: II - A patient with mild systemic                            disease. After reviewing the risks and benefits,                            the patient was deemed in satisfactory condition to                            undergo the procedure.  After obtaining informed consent, the colonoscope                            was passed under direct vision. Throughout the                            procedure, the patient's blood pressure, pulse, and                            oxygen saturations were monitored continuously. The                            Olympus Scope [id number] was introduced through the                            anus and  advanced to the the cecum, identified by                            appendiceal orifice and ileocecal valve. The                            colonoscopy was performed without difficulty. The                            patient tolerated the procedure well. The quality                            of the bowel preparation was good. The ileocecal                            valve, appendiceal orifice, and rectum were                            photographed. Scope In: 11:09:25 AM Scope Out: 11:45:16 AM Scope Withdrawal Time: 0 hours 24 minutes 21 seconds  Total Procedure Duration: 0 hours 35 minutes 51 seconds  Findings:                 The perianal and digital rectal examinations were                            normal.                           A large polyp was found in the proximal transverse                            colon. The polyp was sessile and roughly 30 to 40mm                            in size. The polyp was removed with a piecemeal                            technique using a cold snare. Once the resection  began water irrigation was used to lift the base of                            it which worked well. Resection and retrieval were                            complete. Area distal to it was tattooed with an                            injection of Spot (carbon black).                           A 3 mm polyp was found in the rectum. The polyp was                            sessile. The polyp was removed with a cold snare.                            Resection and retrieval were complete.                           Multiple diverticula were found in the transverse                            colon and left colon.                           The colon was long and tortuous, abdominal pressure                            applied to help achieve cecal intubation.                           Internal hemorrhoids were found during retroflexion.                            The exam was otherwise without abnormality. Complications:            No immediate complications. Estimated blood loss:                            Minimal. Estimated Blood Loss:     Estimated blood loss was minimal. Impression:               - One large polyp in the proximal transverse colon,                            removed piecemeal using a cold snare. Resected and                            retrieved. Tattooed.                           - One 3 mm polyp in the rectum, removed with a cold  snare. Resected and retrieved.                           - Diverticulosis in the transverse colon and in the                            left colon.                           - Tortuous colon.                           - Internal hemorrhoids.                           - The examination was otherwise normal. Recommendation:           - Patient has a contact number available for                            emergencies. The signs and symptoms of potential                            delayed complications were discussed with the                            patient. Return to normal activities tomorrow.                            Written discharge instructions were provided to the                            patient.                           - Resume previous diet.                           - Continue present medications.                           - Await pathology results. Anticipate repeat                            colonoscopy in 6 months for close surveillance. Elspeth P. Leigh, MD 06/17/2024 11:54:22 AM This report has been signed electronically.

## 2024-06-17 NOTE — Patient Instructions (Addendum)
 Please read handouts provided on polyps, diverticulosis, and hemorrhoids.   Resume previous diet.  Continue present medications. Await pathology results.   Repeat colonoscopy in 6 months for close surveillance (call office in January for scheduling).   YOU HAD AN ENDOSCOPIC PROCEDURE TODAY AT THE [facility]:   Refer to the procedure report that was given to you for any specific questions about what was found during the examination.  If the procedure report does not answer your questions, please call your gastroenterologist to clarify.  If you requested that your care partner not be given the details of your procedure findings, then the procedure report has been included in a sealed envelope for you to review at your convenience later.  YOU SHOULD EXPECT: Some feelings of bloating in the abdomen. Passage of more gas than usual.  Walking can help get rid of the air that was put into your GI tract during the procedure and reduce the bloating. If you had a lower endoscopy (such as a colonoscopy or flexible sigmoidoscopy) you may notice spotting of blood in your stool or on the toilet paper. If you underwent a bowel prep for your procedure, you may not have a normal bowel movement for a few days.  Please Note:  You might notice some irritation and congestion in your nose or some drainage.  This is from the oxygen used during your procedure.  There is no need for concern and it should clear up in a day or so.  SYMPTOMS TO REPORT IMMEDIATELY:  Following lower endoscopy (colonoscopy or flexible sigmoidoscopy):  Excessive amounts of blood in the stool  Significant tenderness or worsening of abdominal pains  Swelling of the abdomen that is new, acute  Fever of 100F or higher  For urgent or emergent issues, a gastroenterologist can be reached at any hour by calling [phone]. Do not use MyChart messaging for urgent concerns.    DIET:  We do recommend a small meal at first, but then  you may proceed to your regular diet.  Drink plenty of fluids but you should avoid alcoholic beverages for 24 hours.  ACTIVITY:  You should plan to take it easy for the rest of today and you should NOT DRIVE or use heavy machinery until tomorrow (because of the sedation medicines used during the test).    FOLLOW UP: Our staff will call the number listed on your records the next business day following your procedure.  We will call around 7:15- 8:00 am to check on you and address any questions or concerns that you may have regarding the information given to you following your procedure. If we do not reach you, we will leave a message.     If any biopsies were taken you will be contacted by phone or by letter within the next 1-3 weeks.  Please call us  at [phone] if you have not heard about the biopsies in 3 weeks.    SIGNATURES/CONFIDENTIALITY: You and/or your care partner have signed paperwork which will be entered into your electronic medical record.  These signatures attest to the fact that that the information above on your After Visit Summary has been reviewed and is understood.  Full responsibility of the confidentiality of this discharge information lies with you and/or your care-partner.

## 2024-06-17 NOTE — Progress Notes (Signed)
 Called to room to assist during endoscopic procedure.  Patient ID and intended procedure confirmed with present staff. Received instructions for my participation in the procedure from the performing physician.

## 2024-06-17 NOTE — Progress Notes (Signed)
 [facility] History and Physical   Primary Care Physician:  Larnell Hamilton, MD   Reason for Procedure:   Colon cancer screening  Plan:    colonoscopy     HPI: Kelly [name] is a 68 y.o. female  here for colonoscopy screening - last exam 12/2013. No polyps.  Patient denies any bowel symptoms at this time. No family history of colon cancer known. Otherwise feels well without any cardiopulmonary symptoms.   I have discussed risks / benefits of anesthesia and endoscopic procedure with Kelly [name] and they wish to proceed with the exams as outlined today.    Past Medical History:  Diagnosis Date   Allergy    Anxiety    CLL (chronic lymphocytic leukemia) (HCC) 09/15/2013   Diverticulosis    Hyperlipidemia    Hypertension    Internal hemorrhoids 05/11/07   Dr Luis   Leukocytosis, unspecified 09/14/2013   Thyroid  disease     Past Surgical History:  Procedure Laterality Date   BREAST BIOPSY Right    BREAST SURGERY Right    benign lesion    Prior to Admission medications   Medication Sig Start Date End Date Taking? Authorizing Provider  aspirin 81 MG tablet Take 81 mg by mouth daily.   Yes [provider]  cholecalciferol (VITAMIN D) 1000 UNITS tablet Take 1,000 Units by mouth daily.   Yes [provider]  docusate sodium (COLACE) 100 MG capsule    Yes [provider]  estradiol (VIVELLE-DOT) 0.05 MG/24HR patch Place 1 patch onto the skin once a week.   Yes [provider]  levocetirizine (XYZAL) 5 MG tablet Take 5 mg by mouth every evening.   Yes [provider]  levothyroxine  (SYNTHROID , LEVOTHROID) 50 MCG tablet TAKE 1 TABLET BY MOUTH EVERY DAY. 09/22/14  Yes Levy Nat GAILS, PA-C  lisinopril  (PRINIVIL ,ZESTRIL ) 5 MG tablet Take 1 tablet (5 mg total) by mouth daily. PATIENT NEEDS OFFICE VISIT FOR ADDITIONAL REFILLS 09/22/14  Yes Levy Nat GAILS, PA-C  montelukast (SINGULAIR) 10 MG tablet Take 10 mg by mouth  at bedtime.   Yes [provider]  Omega-3 Fatty Acids (FISH OIL) 1000 MG CAPS  09/23/18  Yes [provider]  rosuvastatin  (CRESTOR ) 10 MG tablet Take 0.5 tablets (5 mg total) by mouth daily. 1/2 tab qd 09/22/14  Yes Bush, Nat GAILS, PA-C  fluticasone  (FLONASE ) 50 MCG/ACT nasal spray fluticasone  propionate 50 mcg/actuation nasal spray,suspension    [provider]  progesterone (PROMETRIUM) 200 MG capsule Take 200 mg by mouth as directed. Every other month    [provider]    Current Outpatient Medications  Medication Sig Dispense Refill   aspirin 81 MG tablet Take 81 mg by mouth daily.     cholecalciferol (VITAMIN D) 1000 UNITS tablet Take 1,000 Units by mouth daily.     docusate sodium (COLACE) 100 MG capsule      estradiol (VIVELLE-DOT) 0.05 MG/24HR patch Place 1 patch onto the skin once a week.     levocetirizine (XYZAL) 5 MG tablet Take 5 mg by mouth every evening.     levothyroxine  (SYNTHROID , LEVOTHROID) 50 MCG tablet TAKE 1 TABLET BY MOUTH EVERY DAY. 90 tablet 1   lisinopril  (PRINIVIL ,ZESTRIL ) 5 MG tablet Take 1 tablet (5 mg total) by mouth daily. PATIENT NEEDS OFFICE VISIT FOR ADDITIONAL REFILLS 90 tablet 1   montelukast (SINGULAIR) 10 MG tablet Take 10 mg by mouth at bedtime.     Omega-3 Fatty Acids (FISH OIL)  1000 MG CAPS      rosuvastatin  (CRESTOR ) 10 MG tablet Take 0.5 tablets (5 mg total) by mouth daily. 1/2 tab qd 90 tablet 1   fluticasone  (FLONASE ) 50 MCG/ACT nasal spray fluticasone  propionate 50 mcg/actuation nasal spray,suspension     progesterone (PROMETRIUM) 200 MG capsule Take 200 mg by mouth as directed. Every other month     Current Facility-Administered Medications  Medication Dose Route Frequency Provider Last Rate Last Admin   0.9 %  sodium chloride  infusion  500 mL Intravenous Once Halden Phegley, Elspeth SQUIBB, MD        Allergies as of 06/17/2024   (No Known Allergies)    Family History  Problem Relation Age of Onset   Cancer  Mother 87       pancreatic cancer   Cancer Father 29       lung cancer; +tobacco   Colon cancer Neg Hx    Colon polyps Neg Hx    Esophageal cancer Neg Hx    Stomach cancer Neg Hx    Rectal cancer Neg Hx     Social History   Socioeconomic History   Marital status: Divorced    Spouse name: n/a   Number of children: 2   Years of education: 13+   Highest education level: Not on file  Occupational History   Occupation: Agricultural engineer: [employer]    Employer: [employer]  Tobacco Use   Smoking status: Never   Smokeless tobacco: Never  Substance and Sexual Activity   Alcohol  use: Yes    Comment: rarely   Drug use: No   Sexual activity: Never  Other Topics Concern   Not on file  Social History Narrative   Lives alone.  Her adult children live nearby.   Social Drivers of Corporate investment banker Strain: Not on file  Food Insecurity: Not on file  Transportation Needs: Not on file  Physical Activity: Not on file  Stress: Not on file  Social Connections: Not on file  Intimate Partner Violence: Not on file    Review of Systems: All other review of systems negative except as mentioned in the HPI.  Physical Exam: Vital signs BP 139/73   Pulse 78   Temp 97.7 F (36.5 C)   Ht 5' 6 (1.676 m)   Wt 165 lb (74.8 kg)   SpO2 97%   BMI 26.63 kg/m   General:   Alert,  Well-developed, pleasant and cooperative in NAD Lungs:  Clear throughout to auscultation.   Heart:  Regular rate and rhythm Abdomen:  Soft, nontender and nondistended.   Neuro/Psych:  Alert and cooperative. Normal mood and affect. A and O x 3  Kelly Naval, MD [facility]

## 2024-06-17 NOTE — Progress Notes (Signed)
 Pt's states no medical or surgical changes since previsit or office visit.

## 2024-06-17 NOTE — Progress Notes (Signed)
 Report to PACU, RN, vss, BBS= Clear.

## 2024-06-18 ENCOUNTER — Telehealth: Payer: Self-pay | Admitting: *Deleted

## 2024-06-18 NOTE — Telephone Encounter (Signed)
  Follow up Call-     06/17/2024   10:07 AM  Call back number  Post procedure Call Back phone  # [phone]  Permission to leave phone message Yes     Patient questions:  Do you have a fever, pain , or abdominal swelling? No. Pain Score  0 *  Have you tolerated food without any problems? Yes.    Have you been able to return to your normal activities? Yes.    Do you have any questions about your discharge instructions: Diet   No. Medications  No. Follow up visit  No.  Do you have questions or concerns about your Care? No.  Actions: * If pain score is 4 or above: No action needed, pain <4.

## 2024-06-21 ENCOUNTER — Ambulatory Visit: Payer: Self-pay | Admitting: Gastroenterology

## 2024-06-21 LAB — SURGICAL PATHOLOGY

## 2024-07-15 DIAGNOSIS — Z23 Encounter for immunization: Secondary | ICD-10-CM | POA: Diagnosis not present

## 2024-09-01 DIAGNOSIS — K029 Dental caries, unspecified: Secondary | ICD-10-CM | POA: Diagnosis not present

## 2025-05-13 ENCOUNTER — Other Ambulatory Visit

## 2025-05-13 ENCOUNTER — Ambulatory Visit: Admitting: Hematology and Oncology
